# Patient Record
Sex: Female | Born: 1995 | Race: Black or African American | Hispanic: No | Marital: Single | Smoking: Never smoker
Health system: Southern US, Community
[De-identification: ages and names within clinical notes are randomized; demographics above are authoritative.]

## PROBLEM LIST (undated history)

## (undated) ENCOUNTER — Inpatient Hospital Stay (HOSPITAL_COMMUNITY): Payer: Self-pay

## (undated) DIAGNOSIS — G43909 Migraine, unspecified, not intractable, without status migrainosus: Secondary | ICD-10-CM

## (undated) DIAGNOSIS — Z8619 Personal history of other infectious and parasitic diseases: Secondary | ICD-10-CM

## (undated) DIAGNOSIS — IMO0002 Reserved for concepts with insufficient information to code with codable children: Secondary | ICD-10-CM

## (undated) HISTORY — DX: Personal history of other infectious and parasitic diseases: Z86.19

## (undated) HISTORY — PX: NO PAST SURGERIES: SHX2092

---
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
## 2013-01-06 ENCOUNTER — Encounter: Payer: Medicaid Other | Admitting: Advanced Practice Midwife

## 2013-02-17 ENCOUNTER — Encounter: Payer: Self-pay | Admitting: Advanced Practice Midwife

## 2013-02-17 ENCOUNTER — Ambulatory Visit (INDEPENDENT_AMBULATORY_CARE_PROVIDER_SITE_OTHER): Payer: Self-pay | Admitting: Advanced Practice Midwife

## 2013-02-17 VITALS — BP 115/76 | Temp 99.3°F | Wt 162.7 lb

## 2013-02-17 DIAGNOSIS — O0932 Supervision of pregnancy with insufficient antenatal care, second trimester: Secondary | ICD-10-CM | POA: Insufficient documentation

## 2013-02-17 DIAGNOSIS — N390 Urinary tract infection, site not specified: Secondary | ICD-10-CM

## 2013-02-17 DIAGNOSIS — O0930 Supervision of pregnancy with insufficient antenatal care, unspecified trimester: Secondary | ICD-10-CM

## 2013-02-17 LAB — POCT URINALYSIS DIP (DEVICE)
Bilirubin Urine: NEGATIVE
Hgb urine dipstick: NEGATIVE
Ketones, ur: NEGATIVE mg/dL
Nitrite: POSITIVE — AB
Protein, ur: NEGATIVE mg/dL
pH: 6.5 (ref 5.0–8.0)

## 2013-02-17 MED ORDER — CEPHALEXIN 500 MG PO CAPS: 500.0000 mg | ORAL_CAPSULE | Freq: Four times a day (QID) | ORAL | Status: AC

## 2013-02-17 NOTE — Patient Instructions (Addendum)
Pregnancy - Second Trimester The second trimester of pregnancy (3 to 6 months) is a period of rapid growth for you and your baby. At the end of the sixth month, your baby is about 9 inches long and weighs 1 1/2 pounds. You will begin to feel the baby move between 18 and 20 weeks of the pregnancy. This is called quickening. Weight gain is faster. A clear fluid (colostrum) may leak out of your breasts. You may feel small contractions of the womb (uterus). This is known as false labor or Braxton-Hicks contractions. This is like a practice for labor when the baby is ready to be born. Usually, the problems with morning sickness have usually passed by the end of your first trimester. Some women develop small dark blotches (called cholasma, mask of pregnancy) on their face that usually goes away after the baby is born. Exposure to the sun makes the blotches worse. Acne may also develop in some pregnant women and pregnant women who have acne, may find that it goes away. PRENATAL EXAMS  Blood work may continue to be done during prenatal exams. These tests are done to check on your health and the probable health of your baby. Blood work is used to follow your blood levels (hemoglobin). Anemia (low hemoglobin) is common during pregnancy. Iron and vitamins are given to help prevent this. You will also be checked for diabetes between 24 and 28 weeks of the pregnancy. Some of the previous blood tests may be repeated.  The size of the uterus is measured during each visit. This is to make sure that the baby is continuing to grow properly according to the dates of the pregnancy.  Your blood pressure is checked every prenatal visit. This is to make sure you are not getting toxemia.  Your urine is checked to make sure you do not have an infection, diabetes or protein in the urine.  Your weight is checked often to make sure gains are happening at the suggested rate. This is to ensure that both you and your baby are  growing normally.  Sometimes, an ultrasound is performed to confirm the proper growth and development of the baby. This is a test which bounces harmless sound waves off the baby so your caregiver can more accurately determine due dates. Sometimes, a test is done on the amniotic fluid surrounding the baby. This test is called an amniocentesis. The amniotic fluid is obtained by sticking a needle into the belly (abdomen). This is done to check the chromosomes in instances where there is a concern about possible genetic problems with the baby. It is also sometimes done near the end of pregnancy if an early delivery is required. In this case, it is done to help make sure the baby's lungs are mature enough for the baby to live outside of the womb. CHANGES OCCURING IN THE SECOND TRIMESTER OF PREGNANCY Your body goes through many changes during pregnancy. They vary from person to person. Talk to your caregiver about changes you notice that you are concerned about.  During the second trimester, you will likely have an increase in your appetite. It is normal to have cravings for certain foods. This varies from person to person and pregnancy to pregnancy.  Your lower abdomen will begin to bulge.  You may have to urinate more often because the uterus and baby are pressing on your bladder. It is also common to get more bladder infections during pregnancy. You can help this by drinking lots of fluids   and emptying your bladder before and after intercourse.  You may begin to get stretch marks on your hips, abdomen, and breasts. These are normal changes in the body during pregnancy. There are no exercises or medicines to take that prevent this change.  You may begin to develop swollen and bulging veins (varicose veins) in your legs. Wearing support hose, elevating your feet for 15 minutes, 3 to 4 times a day and limiting salt in your diet helps lessen the problem.  Heartburn may develop as the uterus grows and  pushes up against the stomach. Antacids recommended by your caregiver helps with this problem. Also, eating smaller meals 4 to 5 times a day helps.  Constipation can be treated with a stool softener or adding bulk to your diet. Drinking lots of fluids, and eating vegetables, fruits, and whole grains are helpful.  Exercising is also helpful. If you have been very active up until your pregnancy, most of these activities can be continued during your pregnancy. If you have been less active, it is helpful to start an exercise program such as walking.  Hemorrhoids may develop at the end of the second trimester. Warm sitz baths and hemorrhoid cream recommended by your caregiver helps hemorrhoid problems.  Backaches may develop during this time of your pregnancy. Avoid heavy lifting, wear low heal shoes, and practice good posture to help with backache problems.  Some pregnant women develop tingling and numbness of their hand and fingers because of swelling and tightening of ligaments in the wrist (carpel tunnel syndrome). This goes away after the baby is born.  As your breasts enlarge, you may have to get a bigger bra. Get a comfortable, cotton, support bra. Do not get a nursing bra until the last month of the pregnancy if you will be nursing the baby.  You may get a dark line from your belly button to the pubic area called the linea nigra.  You may develop rosy cheeks because of increase blood flow to the face.  You may develop spider looking lines of the face, neck, arms, and chest. These go away after the baby is born. HOME CARE INSTRUCTIONS   It is extremely important to avoid all smoking, herbs, alcohol, and unprescribed drugs during your pregnancy. These chemicals affect the formation and growth of the baby. Avoid these chemicals throughout the pregnancy to ensure the delivery of a healthy infant.  Most of your home care instructions are the same as suggested for the first trimester of your  pregnancy. Keep your caregiver's appointments. Follow your caregiver's instructions regarding medicine use, exercise, and diet.  During pregnancy, you are providing food for you and your baby. Continue to eat regular, well-balanced meals. Choose foods such as meat, fish, milk and other low fat dairy products, vegetables, fruits, and whole-grain breads and cereals. Your caregiver will tell you of the ideal weight gain.  A physical sexual relationship may be continued up until near the end of pregnancy if there are no other problems. Problems could include early (premature) leaking of amniotic fluid from the membranes, vaginal bleeding, abdominal pain, or other medical or pregnancy problems.  Exercise regularly if there are no restrictions. Check with your caregiver if you are unsure of the safety of some of your exercises. The greatest weight gain will occur in the last 2 trimesters of pregnancy. Exercise will help you:  Control your weight.  Get you in shape for labor and delivery.  Lose weight after you have the baby.  Wear   a good support or jogging bra for breast tenderness during pregnancy. This may help if worn during sleep. Pads or tissues may be used in the bra if you are leaking colostrum.  Do not use hot tubs, steam rooms or saunas throughout the pregnancy.  Wear your seat belt at all times when driving. This protects you and your baby if you are in an accident.  Avoid raw meat, uncooked cheese, cat litter boxes, and soil used by cats. These carry germs that can cause birth defects in the baby.  The second trimester is also a good time to visit your dentist for your dental health if this has not been done yet. Getting your teeth cleaned is okay. Use a soft toothbrush. Brush gently during pregnancy.  It is easier to leak urine during pregnancy. Tightening up and strengthening the pelvic muscles will help with this problem. Practice stopping your urination while you are going to the  bathroom. These are the same muscles you need to strengthen. It is also the muscles you would use as if you were trying to stop from passing gas. You can practice tightening these muscles up 10 times a set and repeating this about 3 times per day. Once you know what muscles to tighten up, do not perform these exercises during urination. It is more likely to contribute to an infection by backing up the urine.  Ask for help if you have financial, counseling, or nutritional needs during pregnancy. Your caregiver will be able to offer counseling for these needs as well as refer you for other special needs.  Your skin may become oily. If so, wash your face with mild soap, use non-greasy moisturizer and oil or cream based makeup. MEDICINES AND DRUG USE IN PREGNANCY  Take prenatal vitamins as directed. The vitamin should contain 1 milligram of folic acid. Keep all vitamins out of reach of children. Only a couple vitamins or tablets containing iron may be fatal to a baby or young child when ingested.  Avoid use of all medicines, including herbs, over-the-counter medicines, not prescribed or suggested by your caregiver. Only take over-the-counter or prescription medicines for pain, discomfort, or fever as directed by your caregiver. Do not use aspirin.  Let your caregiver also know about herbs you may be using.  Alcohol is related to a number of birth defects. This includes fetal alcohol syndrome. All alcohol, in any form, should be avoided completely. Smoking will cause low birth rate and premature babies.  Street or illegal drugs are very harmful to the baby. They are absolutely forbidden. A baby born to an addicted mother will be addicted at birth. The baby will go through the same withdrawal an adult does. SEEK MEDICAL CARE IF:  You have any concerns or worries during your pregnancy. It is better to call with your questions if you feel they cannot wait, rather than worry about them. SEEK IMMEDIATE  MEDICAL CARE IF:   An unexplained oral temperature above 102 F (38.9 C) develops, or as your caregiver suggests.  You have leaking of fluid from the vagina (birth canal). If leaking membranes are suspected, take your temperature and tell your caregiver of this when you call.  There is vaginal spotting, bleeding, or passing clots. Tell your caregiver of the amount and how many pads are used. Light spotting in pregnancy is common, especially following intercourse.  You develop a bad smelling vaginal discharge with a change in the color from clear to white.  You continue to feel   sick to your stomach (nauseated) and have no relief from remedies suggested. You vomit blood or coffee ground-like materials.  You lose more than 2 pounds of weight or gain more than 2 pounds of weight over 1 week, or as suggested by your caregiver.  You notice swelling of your face, hands, feet, or legs.  You get exposed to German measles and have never had them.  You are exposed to fifth disease or chickenpox.  You develop belly (abdominal) pain. Round ligament discomfort is a common non-cancerous (benign) cause of abdominal pain in pregnancy. Your caregiver still must evaluate you.  You develop a bad headache that does not go away.  You develop fever, diarrhea, pain with urination, or shortness of breath.  You develop visual problems, blurry, or double vision.  You fall or are in a car accident or any kind of trauma.  There is mental or physical violence at home. Document Released: 08/06/2001 Document Revised: 05/06/2012 Document Reviewed: 02/08/2009 [employer] Patient Information 2014 [employer].  

## 2013-02-17 NOTE — Progress Notes (Signed)
New OB. See other note   Subjective:    Shelby Olsen is a G2P1001 [redacted]w[redacted]d being seen today for her first obstetrical visit.  Her obstetrical history is significant for obesity and Hyperemesis first trimester. Patient does intend to breast feed. Pregnancy history fully reviewed.  Patient reports Round ligament pain.  Filed Vitals:   02/17/13 0824  BP: 115/76  Temp: 99.3 F (37.4 C)  Weight: 73.8 kg (162 lb 11.2 oz)    HISTORY: OB History   Grav Para Term Preterm Abortions TAB SAB Ect Mult Living   2 1 1  0 0 0 0 0 0 1     # Outc Date GA Lbr Len/2nd Wgt Sex Del Anes PTL Lv   1 TRM 10/12 [redacted]w[redacted]d  1.610RU([id number]) M SVD EPI  Yes   2 CUR              Past Medical History  Diagnosis Date  . Medical history non-contributory   . Migraine    Past Surgical History  Procedure Laterality Date  . No past surgeries     Family History  Problem Relation Age of Onset  . Hypertension Mother   . Asthma Sister   . Asthma Brother      Exam    Uterus:  Fundal Height: 18 cm  Pelvic Exam:    Perineum: N/a   Vulva: Deferred due to previous exam   Vagina:  n/a   pH:    Cervix: deferred   Adnexa: not evaluated   Bony Pelvis: gynecoid  System: Breast:  normal appearance, no masses or tenderness   Skin: normal coloration and turgor, no rashes    Neurologic: oriented, normal, grossly non-focal   Extremities: normal strength, tone, and muscle mass   HEENT neck supple with midline trachea   Mouth/Teeth mucous membranes moist, pharynx normal without lesions   Neck supple and no masses   Cardiovascular: regular rate and rhythm   Respiratory:  appears well, vitals normal, no respiratory distress, acyanotic, normal RR, ear and throat exam is normal, neck free of mass or lymphadenopathy   Abdomen: soft, non-tender; bowel sounds normal; no masses,  no organomegaly   Urinary: n/a      Assessment:    Pregnancy: G2P1001 Patient Active Problem List   Diagnosis Date Noted  . Late  prenatal care complicating pregnancy in second trimester 02/17/2013  . UTI (lower urinary tract infection) 02/17/2013  . Hyperemesis complicating pregnancy, antepartum 12/22/2012        Plan:     Initial labs drawn. Prenatal vitamins. Problem list reviewed and updated. Genetic Screening discussed Quad Screen: ordered.  Ultrasound discussed; fetal survey: ordered.  Follow up in 4 weeks. 50% of 30 min visit spent on counseling and coordination of care.     [facility] 02/17/2013

## 2013-02-17 NOTE — Progress Notes (Signed)
Pulse- 112 Patient reports severe sharp pains in lower abdomen/pelvis

## 2013-02-18 LAB — OBSTETRIC PANEL
Antibody Screen: NEGATIVE
Basophils Absolute: 0 10*3/uL (ref 0.0–0.1)
Basophils Relative: 0 % (ref 0–1)
Eosinophils Absolute: 0.1 10*3/uL (ref 0.0–0.7)
HCT: 34.9 % — ABNORMAL LOW (ref 36.0–46.0)
Hemoglobin: 11.6 g/dL — ABNORMAL LOW (ref 12.0–15.0)
MCH: 27.8 pg (ref 26.0–34.0)
MCHC: 33.2 g/dL (ref 30.0–36.0)
Monocytes Absolute: 1.1 10*3/uL — ABNORMAL HIGH (ref 0.1–1.0)
Monocytes Relative: 8 % (ref 3–12)
RDW: 13.9 % (ref 11.5–15.5)
Rh Type: POSITIVE

## 2013-02-18 LAB — GLUCOSE TOLERANCE, 1 HOUR (50G) W/O FASTING: Glucose, 1 Hour GTT: 75 mg/dL (ref 70–140)

## 2013-02-22 ENCOUNTER — Ambulatory Visit (HOSPITAL_COMMUNITY)
Admission: RE | Admit: 2013-02-22 | Discharge: 2013-02-22 | Disposition: A | Discharge status: Home / Self Care | Payer: Self-pay | Source: Ambulatory Visit | Attending: Advanced Practice Midwife | Admitting: Advanced Practice Midwife

## 2013-02-22 DIAGNOSIS — Z363 Encounter for antenatal screening for malformations: Secondary | ICD-10-CM | POA: Insufficient documentation

## 2013-02-22 DIAGNOSIS — O0932 Supervision of pregnancy with insufficient antenatal care, second trimester: Secondary | ICD-10-CM

## 2013-02-22 DIAGNOSIS — Z1389 Encounter for screening for other disorder: Secondary | ICD-10-CM | POA: Insufficient documentation

## 2013-02-22 DIAGNOSIS — O358XX0 Maternal care for other (suspected) fetal abnormality and damage, not applicable or unspecified: Secondary | ICD-10-CM | POA: Insufficient documentation

## 2013-02-22 LAB — HEMOGLOBINOPATHY EVALUATION
Hgb A2 Quant: 2.8 % (ref 2.2–3.2)
Hgb A: 97.2 % (ref 96.8–97.8)
Hgb F Quant: 0 % (ref 0.0–2.0)
Hgb S Quant: 0 %

## 2013-02-22 LAB — CULTURE, OB URINE

## 2013-02-23 ENCOUNTER — Encounter: Payer: Self-pay | Admitting: *Deleted

## 2013-03-17 ENCOUNTER — Encounter: Payer: Medicaid Other | Admitting: Obstetrics and Gynecology

## 2013-05-26 ENCOUNTER — Ambulatory Visit (INDEPENDENT_AMBULATORY_CARE_PROVIDER_SITE_OTHER): Payer: Self-pay | Admitting: Advanced Practice Midwife

## 2013-05-26 VITALS — BP 125/80 | Temp 97.3°F | Wt 175.1 lb

## 2013-05-26 DIAGNOSIS — O0932 Supervision of pregnancy with insufficient antenatal care, second trimester: Secondary | ICD-10-CM

## 2013-05-26 DIAGNOSIS — O0930 Supervision of pregnancy with insufficient antenatal care, unspecified trimester: Secondary | ICD-10-CM

## 2013-05-26 LAB — CBC
HCT: 34.5 % — ABNORMAL LOW (ref 36.0–46.0)
Hemoglobin: 11.7 g/dL — ABNORMAL LOW (ref 12.0–15.0)
MCV: 79.9 fL (ref 78.0–100.0)
RDW: 13.5 % (ref 11.5–15.5)
WBC: 11.6 10*3/uL — ABNORMAL HIGH (ref 4.0–10.5)

## 2013-05-26 LAB — POCT URINALYSIS DIP (DEVICE)
Bilirubin Urine: NEGATIVE
Glucose, UA: NEGATIVE mg/dL
Nitrite: POSITIVE — AB
Urobilinogen, UA: 1 mg/dL (ref 0.0–1.0)
pH: 7.5 (ref 5.0–8.0)

## 2013-05-26 LAB — RPR

## 2013-05-26 MED ORDER — PHENAZOPYRIDINE HCL 100 MG PO TABS: 100.0000 mg | ORAL_TABLET | Freq: Three times a day (TID) | ORAL | Status: DC | PRN

## 2013-05-26 MED ORDER — NITROFURANTOIN MONOHYD MACRO 100 MG PO CAPS: 100.0000 mg | ORAL_CAPSULE | Freq: Two times a day (BID) | ORAL | Status: DC

## 2013-05-26 MED ORDER — CEPHALEXIN 500 MG PO CAPS: 500.0000 mg | ORAL_CAPSULE | Freq: Four times a day (QID) | ORAL | Status: AC

## 2013-05-26 NOTE — Progress Notes (Signed)
P= 115 Pt. Reports sharp pain in lower abdomen/pelvic area every time she needs to urinate, started about a week ago.  Pt. Refused flu shot.

## 2013-05-26 NOTE — Progress Notes (Signed)
Reports sharp abdominal pain when urinating/needs to urinate, also regular contractions off and on mostly at night.   Good fetal movement, denies vaginal bleeding, LOF.  Positive nitrites in urine this visit.  Macrobid 100 mg BID x7 days, pyridium 100 mg TID PRN.

## 2013-05-27 LAB — GLUCOSE TOLERANCE, 1 HOUR (50G) W/O FASTING: Glucose, 1 Hour GTT: 84 mg/dL (ref 70–140)

## 2013-06-09 ENCOUNTER — Encounter: Payer: Self-pay | Admitting: Obstetrics and Gynecology

## 2013-06-09 ENCOUNTER — Encounter: Payer: Self-pay | Admitting: Advanced Practice Midwife

## 2013-07-01 ENCOUNTER — Encounter (HOSPITAL_COMMUNITY): Payer: Self-pay

## 2013-07-01 ENCOUNTER — Inpatient Hospital Stay (HOSPITAL_COMMUNITY)
Admission: AD | Admit: 2013-07-01 | Discharge: 2013-07-01 | Disposition: A | Discharge status: Home / Self Care | Payer: Medicaid Other | Source: Ambulatory Visit | Attending: Obstetrics & Gynecology | Admitting: Obstetrics & Gynecology

## 2013-07-01 DIAGNOSIS — O479 False labor, unspecified: Secondary | ICD-10-CM | POA: Insufficient documentation

## 2013-07-01 NOTE — MAU Note (Signed)
Pt states uc began around 0315 and were every 6-7 mins. Denies LOF or vaginal bleeding. States good FM

## 2013-07-06 ENCOUNTER — Encounter (HOSPITAL_COMMUNITY): Payer: Self-pay | Admitting: *Deleted

## 2013-07-06 ENCOUNTER — Inpatient Hospital Stay (HOSPITAL_COMMUNITY)
Admission: AD | Admit: 2013-07-06 | Discharge: 2013-07-06 | Disposition: A | Discharge status: Home / Self Care | Payer: Medicaid Other | Source: Ambulatory Visit | Attending: Obstetrics & Gynecology | Admitting: Obstetrics & Gynecology

## 2013-07-06 DIAGNOSIS — O26859 Spotting complicating pregnancy, unspecified trimester: Secondary | ICD-10-CM | POA: Insufficient documentation

## 2013-07-06 DIAGNOSIS — B373 Candidiasis of vulva and vagina: Secondary | ICD-10-CM

## 2013-07-06 DIAGNOSIS — B3731 Acute candidiasis of vulva and vagina: Secondary | ICD-10-CM

## 2013-07-06 DIAGNOSIS — O2390 Unspecified genitourinary tract infection in pregnancy, unspecified trimester: Secondary | ICD-10-CM | POA: Insufficient documentation

## 2013-07-06 LAB — WET PREP, GENITAL
Clue Cells Wet Prep HPF POC: NONE SEEN
Trich, Wet Prep: NONE SEEN

## 2013-07-06 MED ORDER — FLUCONAZOLE 150 MG PO TABS: 150.0000 mg | ORAL_TABLET | Freq: Once | ORAL | Status: DC

## 2013-07-06 NOTE — MAU Provider Note (Addendum)
History     CSN: [id number]  Arrival date and time: 07/06/13 1436   None     Chief Complaint  Patient presents with  . Vaginal Discharge   HPI  Shelby Olsen is a 17yp G2P1001 who presents today at [redacted]w[redacted]d with c/o greenish vaginal d/c and spotting.  She reports that since she woke up this morning, she has had LBP and feelings of increased pelvic pressure and nausea.  She states that she feels like she is having ctx, and denies feeling anything like this previously.  She receives her routine prenatal care at the [facility] and denies any complications during this pregnancy.  She denies any medical issues or conditions. She has NKDA and denies taking any medications. She is a non-smoker, and denies any alcohol or other drug use. VS reviewed: HR tachycardic in 120s, BP stable at 118/72, 97.8 F temp. U/s reviewed prior to vaginal exam: placenta anterior, above cervical os; no previa present.   At 6am this morning Shelby Olsen states that she noticed green, thick, slimy, and malodorous vaginal d/c.  She denies any leakage of fluids, but does state that she has noticed small amounts of blood on the toilet paper when wiping since. She reports good FM.  She is sexually active, and reports having vaginal intercourse in the past 48 hours.  She denies any exposure to STDs.  She is also reporting a mild HA, that feels like her typical HAs which usually go away on their own.  She also reports increased SOB for the past few days, which is worsened with movement.  She denies any scotomata, CP, vomiting, diarrhea, constipation, LE swelling or pain, fever or chills.  She was dx with a UTI on 10/1, but pt reports not taking her ABX bc her insurance wouldn't cover her Rx.  She denies flank pain, dysuria, increased urinary frequency, or burning with urination.    OB History   Grav Para Term Preterm Abortions TAB SAB Ect Mult Living   2 1 1  0 0 0 0 0 0 1      Past Medical History  Diagnosis Date  . Migraine     Past  Surgical History  Procedure Laterality Date  . No past surgeries      Family History  Problem Relation Age of Onset  . Hypertension Mother   . Asthma Sister   . Asthma Brother     History  Substance Use Topics  . Smoking status: Never Smoker   . Smokeless tobacco: Never Used  . Alcohol Use: No    Allergies:  Allergies  Allergen Reactions  . Peanuts [Peanut Oil] Anaphylaxis and Hives    Prescriptions prior to admission  Medication Sig Dispense Refill  . phenazopyridine (PYRIDIUM) 100 MG tablet Take 1 tablet (100 mg total) by mouth 3 (three) times daily as needed for pain.  10 tablet  0  . Prenatal Vit-Fe Fumarate-FA (MULTIVITAMIN-PRENATAL) 27-0.8 MG TABS tablet Take 1 tablet by mouth daily at 12 noon.        Review of Systems  Constitutional: Negative for fever and chills.  Eyes: Negative for blurred vision.  Respiratory: Positive for shortness of breath. Negative for cough, hemoptysis and wheezing.        For a few days, worse with ambulation  Cardiovascular: Negative for chest pain and leg swelling.  Gastrointestinal: Positive for nausea and abdominal pain. Negative for heartburn, vomiting, diarrhea and constipation.       Increased pelvic pressure.  Diffuse abdominal  discomfort.   Genitourinary: Negative for dysuria, frequency and flank pain.  Musculoskeletal: Positive for back pain.  Neurological: Positive for headaches.   Physical Exam   Blood pressure 118/72, pulse 121, temperature 97.8 F (36.6 C), temperature source Oral, resp. rate 18, height 4\' 11"  (1.499 m), weight 79.833 kg (176 lb), last menstrual period 10/11/2012, SpO2 97.00%.  Physical Exam  Constitutional: She appears well-developed and well-nourished. No distress.  HENT:  Head: Normocephalic and atraumatic.  Eyes: EOM are normal.  Cardiovascular: Regular rhythm, normal heart sounds and intact distal pulses.  Exam reveals no gallop and no friction rub.   No murmur heard. tachycardic   Respiratory: Effort normal and breath sounds normal. No respiratory distress. She has no wheezes.  GI: Bowel sounds are normal. She exhibits distension. There is tenderness.  Gravida abdomen.  Mild tenderness to palpation RUQ>LUQ  Genitourinary: Vaginal discharge found.  Pelvic exam: VULVA: normal appearing vulva with no masses, tenderness or lesions, VAGINA: vaginal discharge - white, creamy, frothy, green, odorless and thick; no fluid pooling or leakage noted CERVIX: friable with q-tip, erythematous and punctate lesions, cervical discharge present - white, copious, creamy, frothy, odorless and thick, DNA probe for chlamydia and GC obtained, cervical motion tenderness absent, multiparous os, UTERUS: enlarged to 38 week's size, ADNEXA: tenderness bilateral, WET MOUNT done - results: many yeast, WBCs.    Results for orders placed during the hospital encounter of 07/06/13 (from the past 24 hour(s))  WET PREP, GENITAL     Status: Abnormal   Collection Time    07/06/13  3:20 PM      Result Value Range   Yeast Wet Prep HPF POC MANY (*) NONE SEEN   Trich, Wet Prep NONE SEEN  NONE SEEN   Clue Cells Wet Prep HPF POC NONE SEEN  NONE SEEN   WBC, Wet Prep HPF POC MANY (*) NONE SEEN    MAU Course  Procedures H/A got better MDM OBIX monitoring: baseline 140s, moderate variability, no accels, no decels; no traceable ctx H&P Vaginal exam with speculum: GC/Chlamydia and wet prep obtained; ferning not indicated Many yeast and many WBC seen on wet prep D/C patient home   Assessment and Plan  A: -Vaginal yeast infection -Pregnancy, third trimester  P:   -Rx: Diflucan 150mg  PO x1 dose -Return to MAU with worsening or new symptoms -Continue with prenatal visits Christiana Fuchs 07/06/2013, 3:05 PM     Medication List         fluconazole 150 MG tablet  Commonly known as:  DIFLUCAN  Take 1 tablet (150 mg total) by mouth once.     multivitamin-prenatal 27-0.8 MG Tabs tablet  Take 1 tablet  by mouth daily at 12 noon.     phenazopyridine 100 MG tablet  Commonly known as:  PYRIDIUM  Take 1 tablet (100 mg total) by mouth 3 (three) times daily as needed for pain.       Follow-up Information   Follow up with [facility] In 1 week. (Someone will call with your appointment)    Specialty:  Obstetrics and Gynecology   Contact information:   [address] [place] [address] [phone]    Evaluation and management procedures were performed by PA-S under my supervision/collaboration. Chart reviewed, patient examined by me and I agree with management and plan. Danae Orleans, CNM 07/06/2013 4:49 PM  Addendum: Misread PA-S note above saying "no accels" which is in error: FHR tracing reactive with accelerations. Danae Orleans, CNM  07/13/2013 1:28 PM

## 2013-07-06 NOTE — MAU Note (Signed)
States she felt a "pop." Then when she went to the bathroom, she noticed green slimy D/C on toilet paper when she wiped. Then some spots of blood.

## 2013-07-06 NOTE — MAU Provider Note (Signed)
Attestation of Attending Supervision of Advanced Practitioner (PA/CNM/NP): Evaluation and management procedures were performed by the Advanced Practitioner under my supervision and collaboration.  I have reviewed the Advanced Practitioner's note and chart, and I agree with the management and plan.  Karlisha Mathena, MD, FACOG Attending Obstetrician & Gynecologist Faculty Practice, [facility]  

## 2013-07-07 ENCOUNTER — Encounter (INDEPENDENT_AMBULATORY_CARE_PROVIDER_SITE_OTHER): Payer: Medicaid Other | Admitting: Obstetrics and Gynecology

## 2013-07-07 DIAGNOSIS — O0930 Supervision of pregnancy with insufficient antenatal care, unspecified trimester: Secondary | ICD-10-CM

## 2013-07-07 LAB — GC/CHLAMYDIA PROBE AMP: GC Probe RNA: NEGATIVE

## 2013-07-07 NOTE — Progress Notes (Signed)
Error (seen yesterday so rescheduled)

## 2013-07-13 ENCOUNTER — Inpatient Hospital Stay (HOSPITAL_COMMUNITY): Payer: Medicaid Other

## 2013-07-13 ENCOUNTER — Inpatient Hospital Stay (HOSPITAL_COMMUNITY)
Admission: AD | Admit: 2013-07-13 | Discharge: 2013-07-14 | DRG: 775 | Disposition: A | Discharge status: Home / Self Care | Payer: Medicaid Other | Source: Ambulatory Visit | Attending: Obstetrics & Gynecology | Admitting: Obstetrics & Gynecology

## 2013-07-13 ENCOUNTER — Encounter: Payer: Self-pay | Admitting: Obstetrics and Gynecology

## 2013-07-13 ENCOUNTER — Encounter (HOSPITAL_COMMUNITY): Payer: Self-pay | Admitting: *Deleted

## 2013-07-13 DIAGNOSIS — O0933 Supervision of pregnancy with insufficient antenatal care, third trimester: Secondary | ICD-10-CM

## 2013-07-13 DIAGNOSIS — IMO0002 Reserved for concepts with insufficient information to code with codable children: Secondary | ICD-10-CM

## 2013-07-13 DIAGNOSIS — O0932 Supervision of pregnancy with insufficient antenatal care, second trimester: Secondary | ICD-10-CM

## 2013-07-13 DIAGNOSIS — O364XX0 Maternal care for intrauterine death, not applicable or unspecified: Secondary | ICD-10-CM

## 2013-07-13 LAB — CBC
HCT: 36.3 % (ref 36.0–46.0)
Hemoglobin: 12.2 g/dL (ref 12.0–15.0)
MCHC: 33.6 g/dL (ref 30.0–36.0)
MCV: 79.8 fL (ref 78.0–100.0)
Platelets: 192 10*3/uL (ref 150–400)
RBC: 4.55 MIL/uL (ref 3.87–5.11)
RDW: 13.7 % (ref 11.5–15.5)
WBC: 16.2 10*3/uL — ABNORMAL HIGH (ref 4.0–10.5)

## 2013-07-13 LAB — RPR: RPR Ser Ql: NONREACTIVE

## 2013-07-13 LAB — TYPE AND SCREEN

## 2013-07-13 MED ORDER — SIMETHICONE 80 MG PO CHEW: 80.0000 mg | CHEWABLE_TABLET | ORAL | Status: DC | PRN

## 2013-07-13 MED ORDER — ZOLPIDEM TARTRATE 5 MG PO TABS
5.0000 mg | ORAL_TABLET | Freq: Every evening | ORAL | Status: DC | PRN
  Administered 2013-07-13: 5 mg via ORAL
  Filled 2013-07-13: qty 1

## 2013-07-13 MED ORDER — IBUPROFEN 600 MG PO TABS
600.0000 mg | ORAL_TABLET | Freq: Four times a day (QID) | ORAL | Status: DC | PRN
  Administered 2013-07-13: 600 mg via ORAL
  Filled 2013-07-13: qty 1

## 2013-07-13 MED ORDER — LIDOCAINE HCL (PF) 1 % IJ SOLN
30.0000 mL | INTRAMUSCULAR | Status: DC | PRN
  Filled 2013-07-13: qty 30

## 2013-07-13 MED ORDER — FLEET ENEMA 7-19 GM/118ML RE ENEM: 1.0000 | ENEMA | RECTAL | Status: DC | PRN

## 2013-07-13 MED ORDER — OXYCODONE-ACETAMINOPHEN 5-325 MG PO TABS
1.0000 | ORAL_TABLET | ORAL | Status: DC | PRN
  Administered 2013-07-13: 2 via ORAL
  Filled 2013-07-13: qty 2

## 2013-07-13 MED ORDER — ONDANSETRON HCL 4 MG/2ML IJ SOLN: 4.0000 mg | INTRAMUSCULAR | Status: DC | PRN

## 2013-07-13 MED ORDER — CITRIC ACID-SODIUM CITRATE 334-500 MG/5ML PO SOLN: 30.0000 mL | ORAL | Status: DC | PRN

## 2013-07-13 MED ORDER — BENZOCAINE-MENTHOL 20-0.5 % EX AERO
1.0000 "application " | INHALATION_SPRAY | CUTANEOUS | Status: DC | PRN
  Filled 2013-07-13: qty 56

## 2013-07-13 MED ORDER — DIPHENHYDRAMINE HCL 25 MG PO CAPS: 25.0000 mg | ORAL_CAPSULE | Freq: Four times a day (QID) | ORAL | Status: DC | PRN

## 2013-07-13 MED ORDER — WITCH HAZEL-GLYCERIN EX PADS: 1.0000 "application " | MEDICATED_PAD | CUTANEOUS | Status: DC | PRN

## 2013-07-13 MED ORDER — LACTATED RINGERS IV SOLN: INTRAVENOUS | Status: DC

## 2013-07-13 MED ORDER — OXYTOCIN BOLUS FROM INFUSION: 500.0000 mL | INTRAVENOUS | Status: DC

## 2013-07-13 MED ORDER — IBUPROFEN 600 MG PO TABS
600.0000 mg | ORAL_TABLET | Freq: Four times a day (QID) | ORAL | Status: DC
  Administered 2013-07-13 – 2013-07-14 (×4): 600 mg via ORAL
  Filled 2013-07-13 (×3): qty 1

## 2013-07-13 MED ORDER — PRENATAL MULTIVITAMIN CH: 1.0000 | ORAL_TABLET | Freq: Every day | ORAL | Status: DC

## 2013-07-13 MED ORDER — LACTATED RINGERS IV SOLN: 500.0000 mL | INTRAVENOUS | Status: DC | PRN

## 2013-07-13 MED ORDER — ACETAMINOPHEN 325 MG PO TABS: 650.0000 mg | ORAL_TABLET | ORAL | Status: DC | PRN

## 2013-07-13 MED ORDER — TETANUS-DIPHTH-ACELL PERTUSSIS 5-2.5-18.5 LF-MCG/0.5 IM SUSP
0.5000 mL | Freq: Once | INTRAMUSCULAR | Status: AC
  Administered 2013-07-14: 0.5 mL via INTRAMUSCULAR
  Filled 2013-07-13: qty 0.5

## 2013-07-13 MED ORDER — DIBUCAINE 1 % RE OINT
1.0000 "application " | TOPICAL_OINTMENT | RECTAL | Status: DC | PRN
  Filled 2013-07-13: qty 28

## 2013-07-13 MED ORDER — ONDANSETRON HCL 4 MG PO TABS: 4.0000 mg | ORAL_TABLET | ORAL | Status: DC | PRN

## 2013-07-13 MED ORDER — OXYTOCIN 40 UNITS IN LACTATED RINGERS INFUSION - SIMPLE MED
62.5000 mL/h | INTRAVENOUS | Status: DC
  Administered 2013-07-13: 62.5 mL/h via INTRAVENOUS
  Filled 2013-07-13: qty 1000

## 2013-07-13 MED ORDER — ONDANSETRON HCL 4 MG/2ML IJ SOLN: 4.0000 mg | Freq: Four times a day (QID) | INTRAMUSCULAR | Status: DC | PRN

## 2013-07-13 MED ORDER — LANOLIN HYDROUS EX OINT: TOPICAL_OINTMENT | CUTANEOUS | Status: DC | PRN

## 2013-07-13 MED ORDER — BUTORPHANOL TARTRATE 1 MG/ML IJ SOLN
2.0000 mg | Freq: Once | INTRAMUSCULAR | Status: AC
  Administered 2013-07-13: 1 mg via INTRAVENOUS
  Filled 2013-07-13: qty 2

## 2013-07-13 MED ORDER — SENNOSIDES-DOCUSATE SODIUM 8.6-50 MG PO TABS
2.0000 | ORAL_TABLET | ORAL | Status: DC
  Administered 2013-07-13: 2 via ORAL
  Filled 2013-07-13: qty 2

## 2013-07-13 NOTE — Progress Notes (Signed)
07/13/13 1200  Clinical Encounter Type  Visited With Patient and family together (boyfriend/FOB Marcello Moores, sister Patsy Lager)  Visit Type Follow-up;Spiritual support;Social support  Spiritual Encounters  Spiritual Needs Grief support;Emotional   Followed up with pt and family, meeting dad for the first time.  Parents primarily needed private time together with baby Imoni.  Spoke in detail with sister Patsy Lager, answering questions and providing emotional support, grief education, and information about bereavement support resources.  Will continue to follow family for bereavement care and emotional support.  [address] [place], [place] [phone]

## 2013-07-13 NOTE — Progress Notes (Signed)
UR completed 

## 2013-07-13 NOTE — H&P (Signed)
Shelby Olsen is a 17 y.o. female presenting for contractions that started last night.  Pt arrived via EMS.  No fetal heart rate noted upon arrival, emergency ultrasound obtained, fetal demise confirmed.  Pt received prenatal care in Low Risk clinic starting at 18 wks.  Pt's next visit was at 32.  All labs and ultrasound were normal.  Reports last fetal movement approximately two hours prior to arrival.  Denies vaginal bleeding or constant abdominal pain. Vital signs normal throughout the pregnancy.    Maternal Medical History:  Reason for admission: Contractions.   Contractions: Onset was 6-12 hours ago.   Frequency: regular.    Fetal activity: Pt reports feeling last movement a couple hours before arrival to hospital.  Prenatal complications: no prenatal complications   OB History   Grav Para Term Preterm Abortions TAB SAB Ect Mult Living   2 1 1  0 0 0 0 0 0 1     Past Medical History  Diagnosis Date  . Migraine    Past Surgical History  Procedure Laterality Date  . No past surgeries     Family History: family history includes Asthma in her brother and sister; Hypertension in her mother. Social History:  reports that she has never smoked. She has never used smokeless tobacco. She reports that she does not drink alcohol or use illicit drugs.   Prenatal Transfer Tool  Maternal Diabetes: No Genetic Screening: Normal Maternal Ultrasounds/Referrals: Normal Fetal Ultrasounds or other Referrals:  None Maternal Substance Abuse:  No Significant Maternal Medications:  None Significant Maternal Lab Results:  Lab values include: Other:  GBS unknown Other Comments:  None  Review of Systems  Gastrointestinal: Positive for abdominal pain (contractions).  All other systems reviewed and are negative.      Blood pressure 121/65, pulse 105, temperature 98.3 F (36.8 C), temperature source Oral, resp. rate 22, height 4\' 11"  (1.499 m), weight 80.74 kg (178 lb), last menstrual  period 10/11/2012. Maternal Exam:  Abdomen: Fetal presentation: vertex  Introitus: Vagina is positive for vaginal discharge (thick discharge, +odor).    Physical Exam  Constitutional: She is oriented to person, place, and time. She appears well-developed and well-nourished.  HENT:  Head: Normocephalic.  Neck: Normal range of motion. Neck supple.  Cardiovascular: Normal rate, regular rhythm and normal heart sounds.   Respiratory: Effort normal and breath sounds normal.  GI: Soft. There is no tenderness.  Genitourinary: No bleeding around the vagina. Vaginal discharge (thick discharge, +odor) found.  Neurological: She is alert and oriented to person, place, and time.  Skin: Skin is warm and dry.     Cervix 7 cm in MAU with quick progression to complete (BBOW) Prenatal labs: ABO, Rh: A/POS/-- (06/25 0944) Antibody: NEG (06/25 0944) Rubella: 4.60 (06/25 0944) RPR: NON REAC (10/01 1019)  HBsAg: NEGATIVE (06/25 0944)  HIV: NON REACTIVE (10/01 1019)  GBS:     Assessment/Plan: 17 yo G2P1001 at [redacted]w[redacted]d wks IUP Intrauterine Fetal Demise Active Labor  Plan: Admit to Birthing Suites Anticipate NSVD   [facility] 07/13/2013, 8:20 AM

## 2013-07-13 NOTE — Progress Notes (Signed)
07/13/13 1030  Clinical Encounter Type  Visited With Patient and family together (mom Marcelino Duster)  Visit Type Spiritual support;Social support (IUFD)  Referral From Nurse  Spiritual Encounters  Spiritual Needs Grief support;Emotional  Stress Factors  Patient Stress Factors Loss;Major life changes;Financial concerns  Family Stress Factors Loss   Visited with Marisue Ivan and her mother Marcelino Duster to offer spiritual and emotional support after delivery.  Provided pastoral presence, reflective listening, staff liaison support, and early grief education, affirming that disbelief and numbness are very typical of the beginning of a bereavement experience.  Marisue Ivan was tearful and also observed, "When this really hits me, I'm going to cry for a week." We began to process some of the feelings, experiences, and need for help/support that will come as the hours, days, and weeks progress.    Liz's boyfriend, two-year-old son Gearlean Alf (spelling?), and perhaps other family members are on the way.  With pt's permission, I will bring a comfort toy (stuffed bear) for Javion and will continue to check on family through the day.  Please also page as needs arise:  [phone].  Thank you.  [address] [place], [place] [phone]

## 2013-07-13 NOTE — H&P (Signed)
Attestation of Attending Supervision of Advanced Practitioner (CNM/NP): Evaluation and management procedures were performed by the Advanced Practitioner under my supervision and collaboration.  I have reviewed the Advanced Practitioner's note and chart, and I agree with the management and plan.  HARRAWAY-SMITH, Myli Pae 9:32 AM

## 2013-07-13 NOTE — MAU Note (Signed)
Contractions started early this morning.  No bleeding or leaking. Was here on Saturday, was 3 cm.  2nd preg, uncomplicated pregnancies.

## 2013-07-14 MED ORDER — IBUPROFEN 600 MG PO TABS: 600.0000 mg | ORAL_TABLET | Freq: Four times a day (QID) | ORAL | Status: DC

## 2013-07-14 NOTE — Progress Notes (Signed)
07/14/13 1000  Clinical Encounter Type  Visited With Patient and family together  Visit Type Follow-up  Spiritual Encounters  Spiritual Needs Grief support;Emotional   Checked in with Marisue Ivan prior to discharge, offering continued emotional support and reminding her of Comfort resources (and encouraging her to reach out any time).  We discussed her self-care plan, including accepting help and making space to grieve in the midst of parenting.  Her immediate plan includes accepting son Javion's grandmother's offer to keep him this weekend so that Marisue Ivan can have time and space to process on her own.  She is aware of ongoing chaplain availability for a 1:1 support meeting or resource referral even after discharge.  Per pt, she has phoned [facility]ango and plans to locate a cemetary to use and then to follow up with funeral home after discharge.  [address] [place], [place] [phone]

## 2013-07-14 NOTE — MAU Provider Note (Signed)

## 2013-07-14 NOTE — Progress Notes (Signed)
Visited patient on referral from Day Shift Chaplain.  Present was 2 friends.  Patient stated a number of times that she still can't believe the event.  We talked some about how her feelings might unfold, about going home to see the baby's room, which will now be empty and other aspects that will begin to make the loss sink in.  Affirmed her right to cry, to be upset, even to be angry at God.  We discussed the randomness of tragedies such as this.    Affirmed the support of friends and family, and affirmed her efforts to do this pregnancy as healthy as possible for her.  Commended her efforts and "discarded" any guilt, potential or current.  Affirmed her wherewithal to walk through this.  We also discussed how to talk to her other child, who is just shy of 2.  Rema Jasmine, Chaplain Pager: [phone]

## 2013-07-14 NOTE — Discharge Summary (Signed)
Attestation of Attending Supervision of Advanced Practitioner (CNM/NP): Evaluation and management procedures were performed by the Advanced Practitioner under my supervision and collaboration.  I have reviewed the Advanced Practitioner's note and chart, and I agree with the management and plan.  Jadynn Epping 07/14/2013 10:22 AM   

## 2013-07-14 NOTE — Discharge Summary (Signed)
Obstetric Discharge Summary Reason for Admission: onset of labor Prenatal Procedures:  ultrasound Intrapartum Procedures: spontaneous vaginal delivery of a term IUFD Postpartum Procedures: none Complications-Operative and Postpartum: none Hemoglobin  Date Value Range Status  07/13/2013 12.2  12.0 - 15.0 g/dL Final     HCT  Date Value Range Status  07/13/2013 36.3  36.0 - 46.0 % Final    Physical Exam:  General: alert, cooperative and no distress Lochia: appropriate Uterine Fundus: firm Incision: na DVT Evaluation: No evidence of DVT seen on physical exam.  Discharge Diagnoses: Term IUFD s/p SVD  Discharge Information: Date: 07/14/2013 Activity: pelvic rest Diet: routine Medications: PNV and Ibuprofen Condition: stable Instructions: refer to practice specific booklet Discharge to: home Follow-up Information   Follow up with [facility] In 4 weeks.   Specialty:  Obstetrics and Gynecology   Contact information:   [address] [place] [address] [phone]      Newborn Data: Live born female  Birth Weight: 5 lb 8.8 oz (2517 g) APGAR: 0, 0  Home with mother.  Pt presented in active labor to the MAU and without doppler-able heart tones. A bedside ultrasound confirmed the presence of an IUFD. She progressed quickly and delivered a term IUFD. No complications in the delivery. Her postpartum care was uncomplicated. The baby was taken to the morgue with plans for an autopsy.  She is unsure about birth control at this time given the circumstances and will f/u in the clinic in 4 weeks.    Amoni Scallan L 07/14/2013, 9:31 AM

## 2013-07-19 ENCOUNTER — Telehealth: Payer: Self-pay | Admitting: *Deleted

## 2013-07-19 NOTE — Telephone Encounter (Signed)
Shelby Olsen called and states she delivered a baby " Shelby Olsen" on 18th and baby's body was sent to [facility] for autopsy.  Wants to get autopsy results. Called Shelby Olsen and informed her autopsy results take several weeks and will go to her doctor- can get the results at her postpartum visit.   Reviewed her postpartum appt date/time with her.

## 2013-07-20 ENCOUNTER — Encounter: Payer: Self-pay | Admitting: Obstetrics and Gynecology

## 2013-07-21 LAB — CHROMOSOME STD, POC(TISSUE)-NCBH

## 2013-07-26 DEATH — deceased

## 2013-08-16 ENCOUNTER — Ambulatory Visit (INDEPENDENT_AMBULATORY_CARE_PROVIDER_SITE_OTHER): Payer: Medicaid Other | Admitting: Obstetrics and Gynecology

## 2013-08-16 ENCOUNTER — Encounter: Payer: Self-pay | Admitting: Obstetrics and Gynecology

## 2013-08-16 VITALS — BP 138/93 | HR 78 | Temp 97.2°F | Wt 163.3 lb

## 2013-08-16 DIAGNOSIS — IMO0002 Reserved for concepts with insufficient information to code with codable children: Secondary | ICD-10-CM

## 2013-08-16 NOTE — Progress Notes (Signed)
Patient ID: Shelby Olsen Seen, female   DOB: [birth year], 17 y.o.   MRN: [id number] Pt. Is unsure of what she would like to do for birth control at this time. States she is considering the nexplanon but is still not sure-- information sheets about Mirena and other birth control options given.

## 2013-08-16 NOTE — Progress Notes (Signed)
  Subjective:     Shelby Olsen is a 17 y.o. female G2P2001who presents for a postpartum visit. She is 5 weeks postpartum following a spontaneous vaginal delivery IIUFD, unexplained. I have fully reviewed the prenatal and intrapartum course. The delivery was at 38.2 gestational weeks. Outcome: spontaneous vaginal delivery, no lacerations. Anesthesia: epidural. Postpartum course has been complicated by grief and loss. Bleeding no bleeding. Bowel function is normal. Bladder function is normal. Patient is not sexually active. Contraception method is abstinence.  Postpartum depression screening: score 9. Sleep and ADL functioning OK. Has support. Has appointment with [facility] for counseling. Family coming for Christmas. Anxious to get autopsy/chromosome report which is pending.     Review of Systems Pertinent items are noted in HPI.   Objective:    BP 138/93  Pulse 78  Temp(Src) 97.2 F (36.2 C) (Oral)  Wt 163 lb 4.8 oz (74.072 kg)  General:  alert, cooperative and no distress   Breasts:  inspection negative, no nipple discharge or bleeding, no masses or nodularity palpable  Lungs: clear to auscultation bilaterally  Heart:  regular rate and rhythm, S1, S2 normal, no murmur, click, rub or gallop                             Assessment:     5 wks postpartum exam. Pap smear not done at today's visit.  S/P unexplained term IUFD coping appropriately Borderline BP elevation  Plan:    1. Contraception: Encouraged LARC. Pt undecided and will call for apointment when she decides. 2. Encourage to continue in counseling as needed Will ask Clinic manager to call pt Monday re: forthcoming results.  Needs F/U 1-2 months for contraception and BP recheck.

## 2013-08-23 ENCOUNTER — Telehealth: Payer: Self-pay | Admitting: *Deleted

## 2013-08-23 NOTE — Telephone Encounter (Signed)
Left message with female that we should know results on Wednesday.

## 2014-06-23 ENCOUNTER — Encounter (HOSPITAL_COMMUNITY): Payer: Self-pay | Admitting: *Deleted

## 2014-06-23 ENCOUNTER — Inpatient Hospital Stay (HOSPITAL_COMMUNITY): Payer: Medicaid Other

## 2014-06-23 ENCOUNTER — Inpatient Hospital Stay (HOSPITAL_COMMUNITY)
Admission: AD | Admit: 2014-06-23 | Discharge: 2014-06-23 | Disposition: A | Discharge status: Home / Self Care | Payer: Medicaid Other | Source: Ambulatory Visit | Attending: Obstetrics & Gynecology | Admitting: Obstetrics & Gynecology

## 2014-06-23 DIAGNOSIS — N3000 Acute cystitis without hematuria: Secondary | ICD-10-CM

## 2014-06-23 DIAGNOSIS — Z3A15 15 weeks gestation of pregnancy: Secondary | ICD-10-CM | POA: Insufficient documentation

## 2014-06-23 DIAGNOSIS — O2342 Unspecified infection of urinary tract in pregnancy, second trimester: Secondary | ICD-10-CM | POA: Diagnosis not present

## 2014-06-23 DIAGNOSIS — B9689 Other specified bacterial agents as the cause of diseases classified elsewhere: Secondary | ICD-10-CM | POA: Diagnosis not present

## 2014-06-23 DIAGNOSIS — N760 Acute vaginitis: Secondary | ICD-10-CM | POA: Diagnosis not present

## 2014-06-23 DIAGNOSIS — O9989 Other specified diseases and conditions complicating pregnancy, childbirth and the puerperium: Secondary | ICD-10-CM

## 2014-06-23 DIAGNOSIS — R109 Unspecified abdominal pain: Secondary | ICD-10-CM

## 2014-06-23 DIAGNOSIS — O26899 Other specified pregnancy related conditions, unspecified trimester: Secondary | ICD-10-CM

## 2014-06-23 DIAGNOSIS — O26892 Other specified pregnancy related conditions, second trimester: Secondary | ICD-10-CM | POA: Insufficient documentation

## 2014-06-23 LAB — URINALYSIS, ROUTINE W REFLEX MICROSCOPIC
Bilirubin Urine: NEGATIVE
Glucose, UA: NEGATIVE mg/dL
Ketones, ur: 15 mg/dL — AB
Nitrite: POSITIVE — AB
Protein, ur: NEGATIVE mg/dL
SPECIFIC GRAVITY, URINE: 1.025 (ref 1.005–1.030)
Urobilinogen, UA: 0.2 mg/dL (ref 0.0–1.0)
pH: 6 (ref 5.0–8.0)

## 2014-06-23 LAB — URINE MICROSCOPIC-ADD ON

## 2014-06-23 LAB — WET PREP, GENITAL
Trich, Wet Prep: NONE SEEN
YEAST WET PREP: NONE SEEN

## 2014-06-23 MED ORDER — CEPHALEXIN 500 MG PO CAPS: 500.0000 mg | ORAL_CAPSULE | Freq: Four times a day (QID) | ORAL | Status: DC

## 2014-06-23 MED ORDER — METRONIDAZOLE 500 MG PO TABS: 500.0000 mg | ORAL_TABLET | Freq: Two times a day (BID) | ORAL | Status: DC

## 2014-06-23 NOTE — MAU Note (Signed)
Pt reports she had had lower abd pain on and off for a week. Noticed some blood when she wiped. Missed appointment today at health dept.

## 2014-06-23 NOTE — Discharge Instructions (Signed)
Bacterial Vaginosis °Bacterial vaginosis is an infection of the vagina. It happens when too many of certain germs (bacteria) grow in the vagina. °HOME CARE °· Take your medicine as told by your doctor. °· Finish your medicine even if you start to feel better. °· Do not have sex until you finish your medicine and are better. °· Tell your sex partner that you have an infection. They should see their doctor for treatment. °· Practice safe sex. Use condoms. Have only one sex partner. °GET HELP IF: °· You are not getting better after 3 days of treatment. °· You have more grey fluid (discharge) coming from your vagina than before. °· You have more pain than before. °· You have a fever. °MAKE SURE YOU:  °· Understand these instructions. °· Will watch your condition. °· Will get help right away if you are not doing well or get worse. °Document Released: 05/21/2008 Document Revised: 06/02/2013 Document Reviewed: 03/24/2013 °ExitCare® Patient Information ©2015 [employer]. This information is not intended to replace advice given to you by your health care provider. Make sure you discuss any questions you have with your health care provider. ° °Urinary Tract Infection °A urinary tract infection (UTI) can occur any place along the urinary tract. The tract includes the kidneys, ureters, bladder, and urethra. A type of germ called bacteria often causes a UTI. UTIs are often helped with antibiotic medicine.  °HOME CARE  °· If given, take antibiotics as told by your doctor. Finish them even if you start to feel better. °· Drink enough fluids to keep your pee (urine) clear or pale yellow. °· Avoid tea, drinks with caffeine, and bubbly (carbonated) drinks. °· Pee often. Avoid holding your pee in for a long time. °· Pee before and after having sex (intercourse). °· Wipe from front to back after you poop (bowel movement) if you are a woman. Use each tissue only once. °GET HELP RIGHT AWAY IF:  °· You have back pain. °· You have lower  belly (abdominal) pain. °· You have chills. °· You feel sick to your stomach (nauseous). °· You throw up (vomit). °· Your burning or discomfort with peeing does not go away. °· You have a fever. °· Your symptoms are not better in 3 days. °MAKE SURE YOU:  °· Understand these instructions. °· Will watch your condition. °· Will get help right away if you are not doing well or get worse. °Document Released: 01/29/2008 Document Revised: 05/06/2012 Document Reviewed: 03/12/2012 °ExitCare® Patient Information ©2015 [employer]. This information is not intended to replace advice given to you by your health care provider. Make sure you discuss any questions you have with your health care provider. ° °

## 2014-06-23 NOTE — MAU Provider Note (Signed)
Attestation of Attending Supervision of Advanced Practitioner (CNM/NP): Evaluation and management procedures were performed by the Advanced Practitioner under my supervision and collaboration.  I have reviewed the Advanced Practitioner's note and chart, and I agree with the management and plan.  HARRAWAY-SMITH, Lavern Crimi 7:39 PM

## 2014-06-23 NOTE — MAU Provider Note (Signed)
History     CSN: [id number]  Arrival date and time: 06/23/14 1441   First Provider Initiated Contact with Patient 06/23/14 1548      Chief Complaint  Patient presents with  . Abdominal Pain   HPI Shelby Olsen is a 18 y.o. G3P2001 at 3753w2d who presents to MAU today with complaint of lower abdominal and back pain. Pain x 2 weeks off and on, worse with ambulation and change or positions. The patient [facility] and missed it. She states spotting off and on x 1 week. She denies vaginal discharge, fever or recent intercourse. She states occasional mild N/V since early pregnancy. She states history of stillborn with last pregnancy at term, unknown cause.    OB History   Grav Para Term Preterm Abortions TAB SAB Ect Mult Living   3 2 2  0 0 0 0 0 0 1      Past Medical History  Diagnosis Date  . Migraine     Past Surgical History  Procedure Laterality Date  . No past surgeries      Family History  Problem Relation Age of Onset  . Hypertension Mother   . Asthma Sister   . Asthma Brother     History  Substance Use Topics  . Smoking status: Never Smoker   . Smokeless tobacco: Never Used  . Alcohol Use: No    Allergies:  Allergies  Allergen Reactions  . Peanuts [Peanut Oil] Anaphylaxis and Hives    No prescriptions prior to admission    Review of Systems  Constitutional: Negative for fever and malaise/fatigue.  Gastrointestinal: Positive for nausea, vomiting and abdominal pain. Negative for diarrhea and constipation.  Genitourinary: Negative for dysuria, urgency and frequency.       + vaginal bleeding Neg - vaginal discharge   Physical Exam   Blood pressure 123/81, pulse 103, temperature 99.2 F (37.3 C), temperature source Oral, resp. rate 18, height 5\' 2"  (1.575 m), weight 179 lb 3.2 oz (81.285 kg), last menstrual period 02/23/2014.  Physical Exam  Constitutional: She is oriented to person, place, and  time. She appears well-developed and well-nourished. No distress.  HENT:  Head: Normocephalic.  Cardiovascular: Normal rate.   Respiratory: Effort normal.  GI: Soft. She exhibits no distension and no mass. There is no tenderness. There is no rebound and no guarding.  Genitourinary: Uterus is enlarged (appropriate for GA). Uterus is not tender. Cervix exhibits no motion tenderness, no discharge and no friability. No bleeding around the vagina. Vaginal discharge (small amount of thin, white discharge noted) found.  Neurological: She is alert and oriented to person, place, and time.  Skin: Skin is warm and dry. No erythema.  Psychiatric: She has a normal mood and affect.   Results for orders placed during the hospital encounter of 06/23/14 (from the past 24 hour(s))  URINALYSIS, ROUTINE W REFLEX MICROSCOPIC     Status: Abnormal   Collection Time    06/23/14  1:00 PM      Result Value Ref Range   Color, Urine YELLOW  YELLOW   APPearance HAZY (*) CLEAR   Specific Gravity, Urine 1.025  1.005 - 1.030   pH 6.0  5.0 - 8.0   Glucose, UA NEGATIVE  NEGATIVE mg/dL   Hgb urine dipstick TRACE (*) NEGATIVE   Bilirubin Urine NEGATIVE  NEGATIVE   Ketones, ur 15 (*) NEGATIVE mg/dL   Protein, ur NEGATIVE  NEGATIVE mg/dL  Urobilinogen, UA 0.2  0.0 - 1.0 mg/dL   Nitrite POSITIVE (*) NEGATIVE   Leukocytes, UA MODERATE (*) NEGATIVE  URINE MICROSCOPIC-ADD ON     Status: Abnormal   Collection Time    06/23/14  1:00 PM      Result Value Ref Range   Squamous Epithelial / LPF FEW (*) RARE   WBC, UA 21-50  <3 WBC/hpf   Bacteria, UA MANY (*) RARE  WET PREP, GENITAL     Status: Abnormal   Collection Time    06/23/14  5:40 PM      Result Value Ref Range   Yeast Wet Prep HPF POC NONE SEEN  NONE SEEN   Trich, Wet Prep NONE SEEN  NONE SEEN   Clue Cells Wet Prep HPF POC MANY (*) NONE SEEN   WBC, Wet Prep HPF POC MODERATE (*) NONE SEEN    MAU Course  Procedures None  MDM FHR - 165 bpm with doppler UA,  wet prep and US today Preliminary US shows no previa or abruption, normal placenta, AFI and cervical length Assessment and Plan  A: SIUP at 3667w2d UTI in pregnancy Bacterial vaginosis  P: Discharge home Rx for Keflex and Flagyl given to patient Patient advised to increased PO hydration Second trimester and pyelonephritis warning signs discussed Patient advised to reschedule appointment with [facility] to start prenatal care Patient may return to MAU as needed or if her condition were to change or worsen  Marny LowensteinJulie N Wenzel, PA-C  06/23/2014, 6:18 PM

## 2014-06-27 ENCOUNTER — Encounter (HOSPITAL_COMMUNITY): Payer: Self-pay | Admitting: *Deleted

## 2014-06-29 LAB — OB RESULTS CONSOLE HIV ANTIBODY (ROUTINE TESTING): HIV: NONREACTIVE

## 2014-06-29 LAB — OB RESULTS CONSOLE GC/CHLAMYDIA
Chlamydia: NEGATIVE
Gonorrhea: NEGATIVE

## 2014-06-29 LAB — OB RESULTS CONSOLE ABO/RH: RH TYPE: POSITIVE

## 2014-06-29 LAB — OB RESULTS CONSOLE HEPATITIS B SURFACE ANTIGEN: Hepatitis B Surface Ag: NEGATIVE

## 2014-06-29 LAB — OB RESULTS CONSOLE ANTIBODY SCREEN: Antibody Screen: NEGATIVE

## 2014-06-29 LAB — OB RESULTS CONSOLE RPR: RPR: NONREACTIVE

## 2014-06-29 LAB — OB RESULTS CONSOLE RUBELLA ANTIBODY, IGM: Rubella: IMMUNE

## 2014-08-26 NOTE — L&D Delivery Note (Signed)
Delivery Note At 8:57 AM a viable female, "Ka' Rion", was delivered via Vaginal, Spontaneous Delivery (Presentation: Middle Occiput Posterior).  APGAR: 8, 9; weight  .   Placenta status: Intact, Spontaneous.  Cord: 3 vessels with the following complications: None.  Cord pH: NA. End-stage bradycardia noted just prior to delivery.    Anesthesia: Epidural  Episiotomy: None Lacerations: 1st degree right periurethral. Suture Repair: 3.0 vicryl Est. Blood Loss (mL): 250 Placenta to [facility] staff for cord blood donation.  Right vaginal wall cyst intact after delivery, slight bruising over area, but no evidence of hematoma formation.  Mom to postpartum.  Baby to Couplet care / Skin to Skin. Patient plans breast feeding while in hospital, breast and bottle after d/c. Plans outpatient circumcision. Desires Nexplanon for contraception.  PCR negative at 0.19.   Woods Gangemi 12/08/2014, 9:20 AM

## 2014-11-21 LAB — OB RESULTS CONSOLE GBS: GBS: NEGATIVE

## 2014-11-28 ENCOUNTER — Inpatient Hospital Stay (HOSPITAL_COMMUNITY): Payer: Medicaid Other

## 2014-11-28 ENCOUNTER — Inpatient Hospital Stay (HOSPITAL_COMMUNITY)
Admission: AD | Admit: 2014-11-28 | Discharge: 2014-11-28 | Disposition: A | Discharge status: Home / Self Care | Payer: Medicaid Other | Source: Ambulatory Visit | Attending: Obstetrics & Gynecology | Admitting: Obstetrics & Gynecology

## 2014-11-28 ENCOUNTER — Other Ambulatory Visit: Payer: Self-pay | Admitting: Obstetrics and Gynecology

## 2014-11-28 ENCOUNTER — Encounter (HOSPITAL_COMMUNITY): Payer: Self-pay | Admitting: *Deleted

## 2014-11-28 DIAGNOSIS — R030 Elevated blood-pressure reading, without diagnosis of hypertension: Secondary | ICD-10-CM | POA: Diagnosis present

## 2014-11-28 DIAGNOSIS — Z3A37 37 weeks gestation of pregnancy: Secondary | ICD-10-CM | POA: Insufficient documentation

## 2014-11-28 DIAGNOSIS — O0933 Supervision of pregnancy with insufficient antenatal care, third trimester: Secondary | ICD-10-CM | POA: Insufficient documentation

## 2014-11-28 LAB — COMPREHENSIVE METABOLIC PANEL
ALK PHOS: 174 U/L — AB (ref 39–117)
ALT: 17 U/L (ref 0–35)
AST: 18 U/L (ref 0–37)
Albumin: 3.1 g/dL — ABNORMAL LOW (ref 3.5–5.2)
Anion gap: 8 (ref 5–15)
BUN: 7 mg/dL (ref 6–23)
CALCIUM: 9.2 mg/dL (ref 8.4–10.5)
CO2: 19 mmol/L (ref 19–32)
Chloride: 110 mmol/L (ref 96–112)
Creatinine, Ser: 0.46 mg/dL — ABNORMAL LOW (ref 0.50–1.10)
GFR calc Af Amer: >90 mL/min (ref >90)
GFR calc non Af Amer: >90 mL/min (ref >90)
Glucose, Bld: 78 mg/dL (ref 70–99)
POTASSIUM: 3.8 mmol/L (ref 3.5–5.1)
Sodium: 137 mmol/L (ref 135–145)
TOTAL PROTEIN: 7.2 g/dL (ref 6.0–8.3)
Total Bilirubin: 0.4 mg/dL (ref 0.3–1.2)

## 2014-11-28 LAB — CBC
HCT: 37.1 % (ref 36.0–46.0)
HEMOGLOBIN: 12.1 g/dL (ref 12.0–15.0)
MCH: 27 pg (ref 26.0–34.0)
MCHC: 32.6 g/dL (ref 30.0–36.0)
MCV: 82.8 fL (ref 78.0–100.0)
Platelets: 218 10*3/uL (ref 150–400)
RBC: 4.48 MIL/uL (ref 3.87–5.11)
RDW: 13.7 % (ref 11.5–15.5)
WBC: 12.9 10*3/uL — ABNORMAL HIGH (ref 4.0–10.5)

## 2014-11-28 LAB — PROTEIN / CREATININE RATIO, URINE
CREATININE, URINE: 52 mg/dL
PROTEIN CREATININE RATIO: 0.17 — AB (ref 0.00–0.15)
Total Protein, Urine: 9 mg/dL

## 2014-11-28 LAB — LACTATE DEHYDROGENASE: LDH: 119 U/L (ref 94–250)

## 2014-11-28 LAB — URIC ACID: URIC ACID, SERUM: 4 mg/dL (ref 2.4–7.0)

## 2014-11-28 MED ORDER — LACTATED RINGERS IV BOLUS (SEPSIS): 1000.0000 mL | Freq: Once | INTRAVENOUS | Status: DC

## 2014-11-28 NOTE — Discharge Instructions (Signed)

## 2014-11-28 NOTE — MAU Note (Signed)
PT  SAYS SHE HAD REG  APPOINTMENT  IN OFFICE  THIS AM-   BP ELEVATED  AND 3 CM,  AND UC -  SO THEY  SENT  HER  HERE.      PT SAYS UC  STARTED HURTING  AT 0500.      DENIES HSV AND MRSA.  GBS- UNSURE

## 2014-11-28 NOTE — MAU Provider Note (Signed)
MAU Addendum Note  Results for orders placed or performed during the hospital encounter of 11/28/14 (from the past 24 hour(s))  Protein / creatinine ratio, urine     Status: Abnormal   Collection Time: 11/28/14 11:53 AM  Result Value Ref Range   Creatinine, Urine 52.00 mg/dL   Total Protein, Urine 9 mg/dL   Protein Creatinine Ratio 0.17 (H) 0.00 - 0.15  CBC     Status: Abnormal   Collection Time: 11/28/14 12:54 PM  Result Value Ref Range   WBC 12.9 (H) 4.0 - 10.5 K/uL   RBC 4.48 3.87 - 5.11 MIL/uL   Hemoglobin 12.1 12.0 - 15.0 g/dL   HCT 16.137.1 09.636.0 - 04.546.0 %   MCV 82.8 78.0 - 100.0 fL   MCH 27.0 26.0 - 34.0 pg   MCHC 32.6 30.0 - 36.0 g/dL   RDW 40.913.7 81.111.5 - 91.415.5 %   Platelets 218 150 - 400 K/uL  Comprehensive metabolic panel     Status: Abnormal   Collection Time: 11/28/14 12:54 PM  Result Value Ref Range   Sodium 137 135 - 145 mmol/L   Potassium 3.8 3.5 - 5.1 mmol/L   Chloride 110 96 - 112 mmol/L   CO2 19 19 - 32 mmol/L   Glucose, Bld 78 70 - 99 mg/dL   BUN 7 6 - 23 mg/dL   Creatinine, Ser 7.820.46 (L) 0.50 - 1.10 mg/dL   Calcium 9.2 8.4 - 95.610.5 mg/dL   Total Protein 7.2 6.0 - 8.3 g/dL   Albumin 3.1 (L) 3.5 - 5.2 g/dL   AST 18 0 - 37 U/L   ALT 17 0 - 35 U/L   Alkaline Phosphatase 174 (H) 39 - 117 U/L   Total Bilirubin 0.4 0.3 - 1.2 mg/dL   GFR calc non Af Amer >90 >90 mL/min   GFR calc Af Amer >90 >90 mL/min   Anion gap 8 5 - 15  Lactate dehydrogenase     Status: None   Collection Time: 11/28/14 12:54 PM  Result Value Ref Range   LDH 119 94 - 250 U/L  Uric acid     Status: None   Collection Time: 11/28/14 12:54 PM  Result Value Ref Range   Uric Acid, Serum 4.0 2.4 - 7.0 mg/dL     Plan: Continue to monitor    Levi StraussVenus Kenshin Splawn, CNM, MSN 11/28/2014. 1:38 PM

## 2014-11-28 NOTE — MAU Note (Signed)
Urine in lab 

## 2014-11-28 NOTE — MAU Provider Note (Signed)
Shelby Olsen is a 19 y.o. G3P2001 at 37.6 came from the office with c/o ctx weeks and elevated BP.  She has a hx of full term IUFU just prior to pushing in 2014.   History     Patient Active Problem List   Diagnosis Date Noted  . IUFD (intrauterine fetal death) 08/16/2013  . Insufficient prenatal care 05/26/2013  . Late prenatal care complicating pregnancy in second trimester 02/17/2013  . UTI (lower urinary tract infection) 02/17/2013  . Hyperemesis complicating pregnancy, antepartum 12/22/2012    No chief complaint on file.  HPI  OB History    Gravida Para Term Preterm AB TAB SAB Ectopic Multiple Living   3 2 2  0 0 0 0 0 0 1      Past Medical History  Diagnosis Date  . Migraine     Past Surgical History  Procedure Laterality Date  . No past surgeries      Family History  Problem Relation Age of Onset  . Hypertension Mother   . Asthma Sister   . Asthma Brother     History  Substance Use Topics  . Smoking status: Never Smoker   . Smokeless tobacco: Never Used  . Alcohol Use: No    Allergies:  Allergies  Allergen Reactions  . Peanuts [Peanut Oil] Anaphylaxis and Hives    Prescriptions prior to admission  Medication Sig Dispense Refill Last Dose  . OVER THE COUNTER MEDICATION Take 1 tablet by mouth 2 (two) times daily. Patient takes Gummy Prenatal Vitamins twice daily.   11/28/2014 at Unknown time  . cephALEXin (KEFLEX) 500 MG capsule Take 1 capsule (500 mg total) by mouth 4 (four) times daily. (Patient not taking: Reported on 11/28/2014) 20 capsule 0 Completed Course at Unknown time  . metroNIDAZOLE (FLAGYL) 500 MG tablet Take 1 tablet (500 mg total) by mouth 2 (two) times daily. (Patient not taking: Reported on 11/28/2014) 14 tablet 0 Completed Course at Unknown time    ROS See HPI above, all other systems are negative  Physical Exam   Blood pressure 121/77, pulse 107, temperature 98.2 F (36.8 C), temperature source Oral, resp. rate 18, height 4'  11" (1.499 m), weight 197 lb 6 oz (89.529 kg), last menstrual period 02/23/2014. Filed Vitals:   11/28/14 1215 11/28/14 1230 11/28/14 1256 11/28/14 1257  BP: 127/79 120/74 121/77 121/77  Pulse: 100 99 107 107  Temp:      TempSrc:      Resp:      Height:      Weight:       Physical Exam Ext:  WNL ABD: Soft, non tender to palpation, no rebound or guarding SVE: 1-2 in ternal os and 3 external os 1cm cyst right labia   ED Course  Assessment: IUP at  37.3weeks Membranes: intact FHR: Category 2, 145, moderate variability, + accel, occasional variable decel.   CTX:  3-4 minutes   Plan: Labs: PIH labs US BPP   Rosemary Pentecost, CNM, MSN 11/28/2014. 1:01 PM

## 2014-11-28 NOTE — MAU Provider Note (Signed)
MAU Addendum Note  Results for orders placed or performed during the hospital encounter of 11/28/14 (from the past 24 hour(s))  Protein / creatinine ratio, urine     Status: Abnormal   Collection Time: 11/28/14 11:53 AM  Result Value Ref Range   Creatinine, Urine 52.00 mg/dL   Total Protein, Urine 9 mg/dL   Protein Creatinine Ratio 0.17 (H) 0.00 - 0.15  CBC     Status: Abnormal   Collection Time: 11/28/14 12:54 PM  Result Value Ref Range   WBC 12.9 (H) 4.0 - 10.5 K/uL   RBC 4.48 3.87 - 5.11 MIL/uL   Hemoglobin 12.1 12.0 - 15.0 g/dL   HCT 04.537.1 40.936.0 - 81.146.0 %   MCV 82.8 78.0 - 100.0 fL   MCH 27.0 26.0 - 34.0 pg   MCHC 32.6 30.0 - 36.0 g/dL   RDW 91.413.7 78.211.5 - 95.615.5 %   Platelets 218 150 - 400 K/uL  Comprehensive metabolic panel     Status: Abnormal   Collection Time: 11/28/14 12:54 PM  Result Value Ref Range   Sodium 137 135 - 145 mmol/L   Potassium 3.8 3.5 - 5.1 mmol/L   Chloride 110 96 - 112 mmol/L   CO2 19 19 - 32 mmol/L   Glucose, Bld 78 70 - 99 mg/dL   BUN 7 6 - 23 mg/dL   Creatinine, Ser 2.130.46 (L) 0.50 - 1.10 mg/dL   Calcium 9.2 8.4 - 08.610.5 mg/dL   Total Protein 7.2 6.0 - 8.3 g/dL   Albumin 3.1 (L) 3.5 - 5.2 g/dL   AST 18 0 - 37 U/L   ALT 17 0 - 35 U/L   Alkaline Phosphatase 174 (H) 39 - 117 U/L   Total Bilirubin 0.4 0.3 - 1.2 mg/dL   GFR calc non Af Amer >90 >90 mL/min   GFR calc Af Amer >90 >90 mL/min   Anion gap 8 5 - 15  Lactate dehydrogenase     Status: None   Collection Time: 11/28/14 12:54 PM  Result Value Ref Range   LDH 119 94 - 250 U/L  Uric acid     Status: None   Collection Time: 11/28/14 12:54 PM  Result Value Ref Range   Uric Acid, Serum 4.0 2.4 - 7.0 mg/dL   FHT 578130, moderate variability, + accel, no decel  Plan: -Discussed need to follow up in office  -Bleeding and Labor Precautions -Kick counts -Encouraged to call if any questions or concerns arise prior to next scheduled office visit.  -Discharged to home in one hour is no variables and  category 1 strip Consulted with Dr. Burna FortsKulwa   Dhriti Fales, CNM, MSN 11/28/2014. 5:35 PM

## 2014-11-28 NOTE — MAU Provider Note (Signed)
MAU Addendum Note  US BPP 8/8, AFI 19.84  Results for orders placed or performed during the hospital encounter of 11/28/14 (from the past 24 hour(s))  Protein / creatinine ratio, urine     Status: Abnormal   Collection Time: 11/28/14 11:53 AM  Result Value Ref Range   Creatinine, Urine 52.00 mg/dL   Total Protein, Urine 9 mg/dL   Protein Creatinine Ratio 0.17 (H) 0.00 - 0.15  CBC     Status: Abnormal   Collection Time: 11/28/14 12:54 PM  Result Value Ref Range   WBC 12.9 (H) 4.0 - 10.5 K/uL   RBC 4.48 3.87 - 5.11 MIL/uL   Hemoglobin 12.1 12.0 - 15.0 g/dL   HCT 16.137.1 09.636.0 - 04.546.0 %   MCV 82.8 78.0 - 100.0 fL   MCH 27.0 26.0 - 34.0 pg   MCHC 32.6 30.0 - 36.0 g/dL   RDW 40.913.7 81.111.5 - 91.415.5 %   Platelets 218 150 - 400 K/uL  Comprehensive metabolic panel     Status: Abnormal   Collection Time: 11/28/14 12:54 PM  Result Value Ref Range   Sodium 137 135 - 145 mmol/L   Potassium 3.8 3.5 - 5.1 mmol/L   Chloride 110 96 - 112 mmol/L   CO2 19 19 - 32 mmol/L   Glucose, Bld 78 70 - 99 mg/dL   BUN 7 6 - 23 mg/dL   Creatinine, Ser 7.820.46 (L) 0.50 - 1.10 mg/dL   Calcium 9.2 8.4 - 95.610.5 mg/dL   Total Protein 7.2 6.0 - 8.3 g/dL   Albumin 3.1 (L) 3.5 - 5.2 g/dL   AST 18 0 - 37 U/L   ALT 17 0 - 35 U/L   Alkaline Phosphatase 174 (H) 39 - 117 U/L   Total Bilirubin 0.4 0.3 - 1.2 mg/dL   GFR calc non Af Amer >90 >90 mL/min   GFR calc Af Amer >90 >90 mL/min   Anion gap 8 5 - 15  Lactate dehydrogenase     Status: None   Collection Time: 11/28/14 12:54 PM  Result Value Ref Range   LDH 119 94 - 250 U/L  Uric acid     Status: None   Collection Time: 11/28/14 12:54 PM  Result Value Ref Range   Uric Acid, Serum 4.0 2.4 - 7.0 mg/dL     Plan:  Per Dr Sallye OberKulwa, IVF, 2 hour observation   Virga Haltiwanger, CNM, MSN 11/28/2014. 4:27 PM

## 2014-11-29 ENCOUNTER — Encounter (HOSPITAL_COMMUNITY): Payer: Self-pay | Admitting: *Deleted

## 2014-11-29 ENCOUNTER — Telehealth (HOSPITAL_COMMUNITY): Payer: Self-pay | Admitting: *Deleted

## 2014-11-29 NOTE — Telephone Encounter (Signed)
Preadmission screen  

## 2014-12-06 DIAGNOSIS — Z8759 Personal history of other complications of pregnancy, childbirth and the puerperium: Secondary | ICD-10-CM | POA: Insufficient documentation

## 2014-12-06 DIAGNOSIS — O0930 Supervision of pregnancy with insufficient antenatal care, unspecified trimester: Secondary | ICD-10-CM | POA: Insufficient documentation

## 2014-12-06 DIAGNOSIS — Z6839 Body mass index (BMI) 39.0-39.9, adult: Secondary | ICD-10-CM | POA: Insufficient documentation

## 2014-12-06 DIAGNOSIS — IMO0002 Reserved for concepts with insufficient information to code with codable children: Secondary | ICD-10-CM

## 2014-12-06 HISTORY — DX: Reserved for concepts with insufficient information to code with codable children: IMO0002

## 2014-12-06 NOTE — H&P (Signed)
Shelby Olsen is a 19 y.o. female, G3P2001 at 39.1 weeks, presenting for IOL due to IUFD at 39.2 weeks in 2014. +FM. Denies ctxs, VB, LOF, h/a, visual disturbances, RUQ pain, CP, SOB, weakness or N/V.  Undecided if will have medicated or un-medicated birth.   Patient Active Problem List   Diagnosis Date Noted  . Normal labor 12/07/2014  . History of migraine headaches - no meds 12/07/2014  . Irregular periods/menstrual cycles 12/07/2014  . Short stature 12/07/2014  . Insufficient prenatal care 12/06/2014  . IUFD (intrauterine fetal death) - in previous pregnancy 12/06/2014  . BMI 39.0-39.9,adult 12/06/2014  . History of measles as a child 12/06/2014  . Late prenatal care 12/06/2014    History of present pregnancy: Patient entered care at 17.1 weeks.   EDC of 12/13/14 was established by 15.2 wk scan. 15.2 wks (MAU) due to bleeding and cramping: Cervical length: 3.93 cm, Cvx: normal appearance by transabdominal scan, Uterus: no abnormality visualized, L ovary: L 3.16 x W 2.22 x H 2.18, volume 8 ml, R ovary: not visualized, Adnexa: no abnormality visualized. Adnexa: no abnormality visualized, no adnexal mass visualized.  17.1 wks for dating (office scan): Anteverted uterus, singleton pregnancy, Breech presentation, normal fluid; vertical pocket = 3.8 cm, cvx closed, ovaries and adnexas WNLs, dates concordant. Anatomy scan: 20 weeks, with normal findings, no previa.   Additional [place] evaluations: 37.6 wks due to Cat 2 tracing in MAU: BPP 8/8, AFI 19.84 cm.   Significant prenatal events: Late entry to care. UTI (e. Coli) at 17.1 wks - treated w/ Macrobid, no TOC performed; sciatica and ongoing back pain - declined ortho and PT evals - encouraged exercises and stretching; URI at 31+ wks - rec supportive care. History of IUFD at 39.2 wks in 2014, declined MFM visit. Had bi-weekly NSTs beginning at 36+ wks; all Cat 1 tracings. Reported regular ctxs and LOF at 37.6 wks - Cat 1 tracing, +ctxs, SSE  neg for ROM - to MAU for c/o h/a and elevated BP - Cat 2 tracing, sent for BPP (8/8), given IVFs and observed x 2 hrs, normal preeclampsia labs and urine PCR, cvx 1-2 cm internal os, 3 cm external os. TWG 11 lbs. Declined flu vaccine and Tdap. Insufficient prenatal care. Last evaluation: Office 11/28/14 @ 37.6 wks by VL, CNM. FHR 140 bpm. Cvx 3/80/-3 (difficult exam). BP 128/98, repeat 122/88.  OB History    Gravida Para Term Preterm AB TAB SAB Ectopic Multiple Living   0 0 0 0 0 0 1    SVD on 06/10/2011 @ 40 wks, 15-hr labor, female infant, birthwt 5lbs 8 oz, Epidural, [facility] "Shelby Olsen" IUFD on 07/13/2013 @ 39.2 wks, 1-hr labor, female infant, birthwt 5lbs 9 oz, no anesthesia/analgesia, [facility] "Shelby Olsen" Past Medical History  Diagnosis Date  . Migraine   . Hx of measles    Past Surgical History  Procedure Laterality Date  . No past surgeries     Family History: family history includes Asthma in her brother and sister; Hypertension in her mother. Social History:  reports that she has never smoked. She has never used smokeless tobacco. She reports that she does not drink alcohol or use illicit drugs.Pt is a single African-American female with a 12th grade education and employed as a Conservation officer, nature. She has no religious preference. FOB Cedric Bush involved.   Prenatal Transfer Tool  Maternal Diabetes: No Genetic Screening: Missed window Maternal Ultrasounds/Referrals: Normal Fetal Ultrasounds or other Referrals:  Other: Declined MFM  consultation Maternal Substance Abuse:  No Significant Maternal Medications:  Meds include: Other: PNVs Significant Maternal Lab Results: Lab values include: Group B Strep negative  TDAP: Declined Flu: Declined  ROS: +FM, -Ctxs, -VB, -LOF  Allergies  Allergen Reactions  . Peanuts [Peanut Oil] Anaphylaxis and Hives     Dilation: 4 Effacement (%): 100 Station: -2 Exam by:: Sherre ScarletKimberly Detrice Cales, CNM Blood pressure 134/82, pulse 105, temperature 98.1 F (36.7 C),  temperature source Oral, resp. rate 16, height 4\' 11"  (1.499 m), weight 197 lb (89.359 kg), last menstrual period 02/23/2014, SpO2 98 %.  Chest clear Heart RRR without murmur Abd gravid, NT, FH CWD Pelvic: 1/50/-3/soft/posterior Cephalic by Leopolds and VE Bishop score: 5 EFW: 6lbs Ext: DTRs 2+ bilaterally, no clonus, no edema  FHR: BL 150-162 bpm w/ mod-marked variability, +accels but do not meet criteria for reactivity. Mild variables noted. UCs: Irregular, q 6-9 min, palpate mild  Prenatal labs: ABO, Rh: --/--/A POS (04/13 1955) Antibody: NEG (04/13 1955) Rubella: Immune  RPR: Non Reactive (04/13 1955)  HBsAg: Negative (11/04 0000)  HIV: Non-reactive (11/04 0000)  GBS: Negative (03/28 0000) Sickle cell/Hgb electrophoresis: AA Pap: NA GC: Neg on 06/29/14 and 11/21/14 Chlamydia: Neg on 06/29/14 and 11/21/14 Genetic screenings: Missed window Glucola: Normal at 70 Other: UTI on 06/29/14, TOC not performed; VZV immune  Initial Hgb 11.7, 12.5 at 28 weeks   Assessment: IUP at 39.1 wks IOL due to IUFD at 39.2 wks in 2014 GBS neg Late entry to care Lapse in care Unfavorable cervix Elevated BP at last office visit with normal preeclampsia labs/urine PCR Elevated BMI (39.8) Short stature Maternal Tachycardia Cat 2 FHRT  [facility] Dr[facility] Charlotta Newtonzan. Routine [facility] orders. IVF bolus for periodic FHR changes. Intrauterine resuscitative measures prn. Baseline preeclampsia labs/urine PCR with admission labs. Pain med/epidural prn. Reviewed R&B of induction with patient and partner, including need for serial induction, risk of C/S, need for further intervention. Patient and partner seem to understand these risks and are agreeable with proceeding.  Discussed method of induction to include foley bulb and low dose Pitocin (with Cat 1 tracing). Pt in agreement. Regular diet prior to initiating IOL.  Consult prn. Expect SVD.   Sherre ScarletWILLIAMS, Lula Michaux CNM,  MS 12/07/14, 8:50 PM

## 2014-12-07 ENCOUNTER — Encounter (HOSPITAL_COMMUNITY): Payer: Self-pay

## 2014-12-07 ENCOUNTER — Inpatient Hospital Stay (HOSPITAL_COMMUNITY)
Admission: RE | Admit: 2014-12-07 | Discharge: 2014-12-10 | DRG: 775 | Disposition: A | Discharge status: Home / Self Care | Payer: Medicaid Other | Source: Ambulatory Visit | Attending: Obstetrics and Gynecology | Admitting: Obstetrics and Gynecology

## 2014-12-07 VITALS — BP 118/62 | HR 100 | Temp 98.3°F | Resp 18 | Ht 59.0 in | Wt 197.0 lb

## 2014-12-07 DIAGNOSIS — R6252 Short stature (child): Secondary | ICD-10-CM

## 2014-12-07 DIAGNOSIS — Z8669 Personal history of other diseases of the nervous system and sense organs: Secondary | ICD-10-CM

## 2014-12-07 DIAGNOSIS — Z6835 Body mass index (BMI) 35.0-35.9, adult: Secondary | ICD-10-CM

## 2014-12-07 DIAGNOSIS — Z3483 Encounter for supervision of other normal pregnancy, third trimester: Secondary | ICD-10-CM | POA: Diagnosis present

## 2014-12-07 DIAGNOSIS — Z8619 Personal history of other infectious and parasitic diseases: Secondary | ICD-10-CM

## 2014-12-07 DIAGNOSIS — O0932 Supervision of pregnancy with insufficient antenatal care, second trimester: Secondary | ICD-10-CM

## 2014-12-07 DIAGNOSIS — Z3A39 39 weeks gestation of pregnancy: Secondary | ICD-10-CM | POA: Diagnosis present

## 2014-12-07 DIAGNOSIS — N926 Irregular menstruation, unspecified: Secondary | ICD-10-CM

## 2014-12-07 LAB — LACTATE DEHYDROGENASE: LDH: 125 U/L (ref 94–250)

## 2014-12-07 LAB — CBC
HCT: 35.2 % — ABNORMAL LOW (ref 36.0–46.0)
Hemoglobin: 11.8 g/dL — ABNORMAL LOW (ref 12.0–15.0)
MCH: 27.8 pg (ref 26.0–34.0)
MCHC: 33.5 g/dL (ref 30.0–36.0)
MCV: 82.8 fL (ref 78.0–100.0)
PLATELETS: 227 10*3/uL (ref 150–400)
RBC: 4.25 MIL/uL (ref 3.87–5.11)
RDW: 13.8 % (ref 11.5–15.5)
WBC: 10.6 10*3/uL — ABNORMAL HIGH (ref 4.0–10.5)

## 2014-12-07 LAB — COMPREHENSIVE METABOLIC PANEL
ALT: 14 U/L (ref 0–35)
AST: 16 U/L (ref 0–37)
Albumin: 3.3 g/dL — ABNORMAL LOW (ref 3.5–5.2)
Alkaline Phosphatase: 190 U/L — ABNORMAL HIGH (ref 39–117)
Anion gap: 9 (ref 5–15)
BUN: 10 mg/dL (ref 6–23)
CHLORIDE: 107 mmol/L (ref 96–112)
CO2: 18 mmol/L — AB (ref 19–32)
Calcium: 8.9 mg/dL (ref 8.4–10.5)
Creatinine, Ser: 0.44 mg/dL — ABNORMAL LOW (ref 0.50–1.10)
GFR calc Af Amer: >90 mL/min (ref >90)
Glucose, Bld: 90 mg/dL (ref 70–99)
Potassium: 3.4 mmol/L — ABNORMAL LOW (ref 3.5–5.1)
Sodium: 134 mmol/L — ABNORMAL LOW (ref 135–145)
Total Bilirubin: 0.3 mg/dL (ref 0.3–1.2)
Total Protein: 7.3 g/dL (ref 6.0–8.3)

## 2014-12-07 LAB — TYPE AND SCREEN
ABO/RH(D): A POS
Antibody Screen: NEGATIVE

## 2014-12-07 LAB — URIC ACID: Uric Acid, Serum: 4.6 mg/dL (ref 2.4–7.0)

## 2014-12-07 MED ORDER — TERBUTALINE SULFATE 1 MG/ML IJ SOLN: 0.2500 mg | Freq: Once | INTRAMUSCULAR | Status: AC | PRN

## 2014-12-07 MED ORDER — LACTATED RINGERS IV SOLN
500.0000 mL | Freq: Once | INTRAVENOUS | Status: AC
  Administered 2014-12-08: 500 mL via INTRAVENOUS

## 2014-12-07 MED ORDER — ONDANSETRON HCL 4 MG/2ML IJ SOLN: 4.0000 mg | Freq: Four times a day (QID) | INTRAMUSCULAR | Status: DC | PRN

## 2014-12-07 MED ORDER — PHENYLEPHRINE 40 MCG/ML (10ML) SYRINGE FOR IV PUSH (FOR BLOOD PRESSURE SUPPORT)
80.0000 ug | PREFILLED_SYRINGE | INTRAVENOUS | Status: DC | PRN
  Filled 2014-12-07: qty 2

## 2014-12-07 MED ORDER — OXYCODONE-ACETAMINOPHEN 5-325 MG PO TABS: 1.0000 | ORAL_TABLET | ORAL | Status: DC | PRN

## 2014-12-07 MED ORDER — FENTANYL 2.5 MCG/ML BUPIVACAINE 1/10 % EPIDURAL INFUSION (WH - ANES)
14.0000 mL/h | INTRAMUSCULAR | Status: DC | PRN
  Administered 2014-12-08 (×2): 14 mL/h via EPIDURAL
  Filled 2014-12-07: qty 125

## 2014-12-07 MED ORDER — OXYTOCIN 40 UNITS IN LACTATED RINGERS INFUSION - SIMPLE MED
1.0000 m[IU]/min | INTRAVENOUS | Status: DC
  Administered 2014-12-07: 1 m[IU]/min via INTRAVENOUS

## 2014-12-07 MED ORDER — EPHEDRINE 5 MG/ML INJ
10.0000 mg | INTRAVENOUS | Status: DC | PRN
  Filled 2014-12-07: qty 2

## 2014-12-07 MED ORDER — OXYTOCIN BOLUS FROM INFUSION
500.0000 mL | INTRAVENOUS | Status: DC
  Administered 2014-12-08: 500 mL via INTRAVENOUS

## 2014-12-07 MED ORDER — LIDOCAINE HCL (PF) 1 % IJ SOLN
30.0000 mL | INTRAMUSCULAR | Status: DC | PRN
  Filled 2014-12-07: qty 30

## 2014-12-07 MED ORDER — CITRIC ACID-SODIUM CITRATE 334-500 MG/5ML PO SOLN: 30.0000 mL | ORAL | Status: DC | PRN

## 2014-12-07 MED ORDER — ACETAMINOPHEN 325 MG PO TABS: 650.0000 mg | ORAL_TABLET | ORAL | Status: DC | PRN

## 2014-12-07 MED ORDER — HYDROXYZINE HCL 50 MG PO TABS
50.0000 mg | ORAL_TABLET | Freq: Four times a day (QID) | ORAL | Status: DC | PRN
  Filled 2014-12-07: qty 1

## 2014-12-07 MED ORDER — LACTATED RINGERS IV SOLN
INTRAVENOUS | Status: DC
  Administered 2014-12-07 – 2014-12-08 (×5): via INTRAVENOUS

## 2014-12-07 MED ORDER — PHENYLEPHRINE 40 MCG/ML (10ML) SYRINGE FOR IV PUSH (FOR BLOOD PRESSURE SUPPORT)
80.0000 ug | PREFILLED_SYRINGE | INTRAVENOUS | Status: DC | PRN
  Filled 2014-12-07: qty 2
  Filled 2014-12-07: qty 20

## 2014-12-07 MED ORDER — LACTATED RINGERS IV SOLN
500.0000 mL | INTRAVENOUS | Status: DC | PRN
  Administered 2014-12-08: 500 mL via INTRAVENOUS
  Administered 2014-12-08: 250 mL via INTRAVENOUS

## 2014-12-07 MED ORDER — OXYTOCIN 40 UNITS IN LACTATED RINGERS INFUSION - SIMPLE MED: 62.5000 mL/h | INTRAVENOUS | Status: DC

## 2014-12-07 MED ORDER — OXYCODONE-ACETAMINOPHEN 5-325 MG PO TABS: 2.0000 | ORAL_TABLET | ORAL | Status: DC | PRN

## 2014-12-07 MED ORDER — DIPHENHYDRAMINE HCL 50 MG/ML IJ SOLN: 12.5000 mg | INTRAMUSCULAR | Status: DC | PRN

## 2014-12-07 MED ORDER — NALBUPHINE HCL 10 MG/ML IJ SOLN
10.0000 mg | INTRAMUSCULAR | Status: DC | PRN
  Administered 2014-12-07 – 2014-12-08 (×2): 10 mg via INTRAVENOUS
  Filled 2014-12-07 (×2): qty 1

## 2014-12-07 MED ORDER — OXYTOCIN 40 UNITS IN LACTATED RINGERS INFUSION - SIMPLE MED
1.0000 m[IU]/min | INTRAVENOUS | Status: DC
  Filled 2014-12-07: qty 1000

## 2014-12-07 MED ORDER — FLEET ENEMA 7-19 GM/118ML RE ENEM: 1.0000 | ENEMA | RECTAL | Status: DC | PRN

## 2014-12-07 NOTE — Progress Notes (Addendum)
  Subjective: Ate light meal, ready to proceed w/ induction. Still not feeling ctxs. FOB and couple's son at bedside.   Objective: BP 132/73 mmHg  Pulse 112  Temp(Src) 98.1 F (36.7 C) (Oral)  Resp 16  Ht 4\' 11"  (1.499 m)  Wt 197 lb (89.359 kg)  BMI 39.77 kg/m2  LMP 02/23/2014     FHT: BL 130 w/ moderate variability, +accels, no decels UC:   irregular, every 4-7 minutes SVE:   Dilation: 1 Effacement (%): 50 Station: -3 Exam by:: K.Syris Brookens, CNM Intracervical balloon passed just beyond internal os via speculum. Balloon filled w/ 60 ml fluid slowly, traction applied on catheter.  Of note: Quarter sized fluid filled non-painful cyst on R vaginal wall, non-tender, no erythema. No trauma to cyst while placing foley bulb with speculum.   Results for orders placed or performed during the hospital encounter of 12/07/14 (from the past 24 hour(s))  CBC     Status: Abnormal   Collection Time: 12/07/14  7:55 PM  Result Value Ref Range   WBC 10.6 (H) 4.0 - 10.5 K/uL   RBC 4.25 3.87 - 5.11 MIL/uL   Hemoglobin 11.8 (L) 12.0 - 15.0 g/dL   HCT 16.135.2 (L) 09.636.0 - 04.546.0 %   MCV 82.8 78.0 - 100.0 fL   MCH 27.8 26.0 - 34.0 pg   MCHC 33.5 30.0 - 36.0 g/dL   RDW 40.913.8 81.111.5 - 91.415.5 %   Platelets 227 150 - 400 K/uL  Comprehensive metabolic panel     Status: Abnormal   Collection Time: 12/07/14  7:55 PM  Result Value Ref Range   Sodium 134 (L) 135 - 145 mmol/L   Potassium 3.4 (L) 3.5 - 5.1 mmol/L   Chloride 107 96 - 112 mmol/L   CO2 18 (L) 19 - 32 mmol/L   Glucose, Bld 90 70 - 99 mg/dL   BUN 10 6 - 23 mg/dL   Creatinine, Ser 7.820.44 (L) 0.50 - 1.10 mg/dL   Calcium 8.9 8.4 - 95.610.5 mg/dL   Total Protein 7.3 6.0 - 8.3 g/dL   Albumin 3.3 (L) 3.5 - 5.2 g/dL   AST 16 0 - 37 U/L   ALT 14 0 - 35 U/L   Alkaline Phosphatase 190 (H) 39 - 117 U/L   Total Bilirubin 0.3 0.3 - 1.2 mg/dL   GFR calc non Af Amer >90 >90 mL/min   GFR calc Af Amer >90 >90 mL/min   Anion gap 9 5 - 15  Lactate dehydrogenase      Status: None   Collection Time: 12/07/14  7:55 PM  Result Value Ref Range   LDH 125 94 - 250 U/L  Uric acid     Status: None   Collection Time: 12/07/14  7:55 PM  Result Value Ref Range   Uric Acid, Serum 4.6 2.4 - 7.0 mg/dL  Type and screen     Status: None   Collection Time: 12/07/14  7:55 PM  Result Value Ref Range   ABO/RH(D) A POS    Antibody Screen NEG    Sample Expiration 12/10/2014    Assessment:  IOL due to h/o IUFD Cat 1 FHRT Normal preeclampsia labs. Will collect urine PCR when pt up to void  Plan: Strip reviewed by Dr. Charlotta Newtonzan Will begin low dose Pitocin Await extrusion of foley bulb Consult re: cyst  Monitor closely  Sherre ScarletWILLIAMS, Berdine Rasmusson CNM 12/07/2014, 10:16 PM

## 2014-12-08 ENCOUNTER — Encounter (HOSPITAL_COMMUNITY): Payer: Self-pay

## 2014-12-08 ENCOUNTER — Inpatient Hospital Stay (HOSPITAL_COMMUNITY): Payer: Medicaid Other | Admitting: Anesthesiology

## 2014-12-08 LAB — PROTEIN / CREATININE RATIO, URINE
CREATININE, URINE: 119 mg/dL
Protein Creatinine Ratio: 0.19 — ABNORMAL HIGH (ref 0.00–0.15)
TOTAL PROTEIN, URINE: 23 mg/dL

## 2014-12-08 LAB — RPR: RPR: NONREACTIVE

## 2014-12-08 MED ORDER — TERBUTALINE SULFATE 1 MG/ML IJ SOLN
0.2500 mg | Freq: Once | INTRAMUSCULAR | Status: DC | PRN
Start: 2014-12-08 — End: 2014-12-08
  Filled 2014-12-08: qty 1

## 2014-12-08 MED ORDER — ONDANSETRON HCL 4 MG/2ML IJ SOLN: 4.0000 mg | INTRAMUSCULAR | Status: DC | PRN

## 2014-12-08 MED ORDER — PRENATAL MULTIVITAMIN CH
1.0000 | ORAL_TABLET | Freq: Every day | ORAL | Status: DC
  Administered 2014-12-09 – 2014-12-10 (×2): 1 via ORAL
  Filled 2014-12-08 (×3): qty 1

## 2014-12-08 MED ORDER — DIBUCAINE 1 % RE OINT: 1.0000 "application " | TOPICAL_OINTMENT | RECTAL | Status: DC | PRN

## 2014-12-08 MED ORDER — BENZOCAINE-MENTHOL 20-0.5 % EX AERO
1.0000 "application " | INHALATION_SPRAY | CUTANEOUS | Status: DC | PRN
  Administered 2014-12-08: 1 via TOPICAL
  Filled 2014-12-08: qty 56

## 2014-12-08 MED ORDER — LANOLIN HYDROUS EX OINT: TOPICAL_OINTMENT | CUTANEOUS | Status: DC | PRN

## 2014-12-08 MED ORDER — ZOLPIDEM TARTRATE 5 MG PO TABS: 5.0000 mg | ORAL_TABLET | Freq: Every evening | ORAL | Status: DC | PRN

## 2014-12-08 MED ORDER — ACETAMINOPHEN 325 MG PO TABS
650.0000 mg | ORAL_TABLET | ORAL | Status: DC | PRN
Start: 2014-12-08 — End: 2014-12-10

## 2014-12-08 MED ORDER — OXYCODONE-ACETAMINOPHEN 5-325 MG PO TABS
2.0000 | ORAL_TABLET | ORAL | Status: DC | PRN
  Administered 2014-12-08 – 2014-12-10 (×6): 2 via ORAL
  Filled 2014-12-08 (×6): qty 2

## 2014-12-08 MED ORDER — OXYTOCIN 40 UNITS IN LACTATED RINGERS INFUSION - SIMPLE MED: 1.0000 m[IU]/min | INTRAVENOUS | Status: DC

## 2014-12-08 MED ORDER — OXYCODONE-ACETAMINOPHEN 5-325 MG PO TABS
1.0000 | ORAL_TABLET | ORAL | Status: DC | PRN
  Administered 2014-12-08: 1 via ORAL
  Filled 2014-12-08 (×3): qty 1

## 2014-12-08 MED ORDER — SENNOSIDES-DOCUSATE SODIUM 8.6-50 MG PO TABS
2.0000 | ORAL_TABLET | ORAL | Status: DC
  Administered 2014-12-08 – 2014-12-09 (×2): 2 via ORAL
  Filled 2014-12-08 (×2): qty 2

## 2014-12-08 MED ORDER — ONDANSETRON HCL 4 MG PO TABS: 4.0000 mg | ORAL_TABLET | ORAL | Status: DC | PRN

## 2014-12-08 MED ORDER — WITCH HAZEL-GLYCERIN EX PADS: 1.0000 "application " | MEDICATED_PAD | CUTANEOUS | Status: DC | PRN

## 2014-12-08 MED ORDER — SIMETHICONE 80 MG PO CHEW
80.0000 mg | CHEWABLE_TABLET | ORAL | Status: DC | PRN
Start: 2014-12-08 — End: 2014-12-10

## 2014-12-08 MED ORDER — DIPHENHYDRAMINE HCL 25 MG PO CAPS
25.0000 mg | ORAL_CAPSULE | Freq: Four times a day (QID) | ORAL | Status: DC | PRN
Start: 2014-12-08 — End: 2014-12-10

## 2014-12-08 MED ORDER — TETANUS-DIPHTH-ACELL PERTUSSIS 5-2.5-18.5 LF-MCG/0.5 IM SUSP: 0.5000 mL | Freq: Once | INTRAMUSCULAR | Status: DC

## 2014-12-08 MED ORDER — IBUPROFEN 600 MG PO TABS
600.0000 mg | ORAL_TABLET | Freq: Four times a day (QID) | ORAL | Status: DC
  Administered 2014-12-08 – 2014-12-10 (×9): 600 mg via ORAL
  Filled 2014-12-08 (×9): qty 1

## 2014-12-08 MED ORDER — LACTATED RINGERS IV SOLN
INTRAVENOUS | Status: DC
  Administered 2014-12-08 (×2): via INTRAUTERINE

## 2014-12-08 MED ORDER — LIDOCAINE HCL (PF) 1 % IJ SOLN
INTRAMUSCULAR | Status: DC | PRN
  Administered 2014-12-08 (×2): 5 mL
  Administered 2014-12-08: 3 mL

## 2014-12-08 NOTE — Progress Notes (Addendum)
  Subjective: C/O increased discomfort. Has had 2 doses of Nubain w/ minimal relief - asked for something stronger. Declines epidural at present. Denies sxs of preeclampsia. FOB at bedside.  Was asked by RN to review tracing.  Objective: BP 134/82 mmHg  Pulse 105  Temp(Src) 98.1 F (36.7 C) (Oral)  Resp 16  Ht 4\' 11"  (1.499 m)  Wt 197 lb (89.359 kg)  BMI 39.77 kg/m2  SpO2 98%  LMP 02/23/2014     Filed Vitals:   12/08/14 0400  BP: 134/82  Pulse: 105  Temp:   Resp:    One elevated BP of 144/87 at 03:30 AM  FHT: BL 130 w/ min variability, no accels, earlys, mild-mod variables to nadir 95-118 - slow return to baseline, no lates UC:   irregular, every 1-5 minutes SVE: Deferred   Pitocin at 6 mius/min Foley bulb taut  Assessment:  IUP at 39.2 wks IOL due to h/o term IUFD in 2014 Inadequate pain control Cat 2 FHRT Maternal tachycardia - 105 -114 bpm  Plan: Pit off. Continue other resuscitative measures prn. Explained that epidural would be next step to achieve desired comfort level. Continue IOL w/ just foley bulb for now. Consult prn. Expect progress and SVD.   Sherre ScarletWILLIAMS, Shelby Turbin CNM 12/08/2014, 4:37 AM

## 2014-12-08 NOTE — Anesthesia Preprocedure Evaluation (Signed)

## 2014-12-08 NOTE — Anesthesia Postprocedure Evaluation (Signed)
  Anesthesia Post-op Note  Patient: Shelby Olsen  Procedure(s) Performed: * No procedures listed *  Patient Location: PACU and Mother/Baby  Anesthesia Type:Epidural  Level of Consciousness: awake, alert  and oriented  Airway and Oxygen Therapy: Patient Spontanous Breathing  Post-op Pain: mild  Post-op Assessment: Post-op Vital signs reviewed, Patient's Cardiovascular Status Stable, Respiratory Function Stable, No signs of Nausea or vomiting, Adequate PO intake, Pain level controlled, No headache, No backache, No residual numbness and No residual motor weakness  Post-op Vital Signs: Reviewed and stable  Last Vitals:  Filed Vitals:   12/08/14 1233  BP: 127/77  Pulse: 104  Temp:   Resp: 20    Complications: No apparent anesthesia complications

## 2014-12-08 NOTE — Discharge Summary (Signed)
Vaginal Delivery Discharge Summary  Shelby Olsen  DOB:    [birth year] MRN:    [id number] CSN:    [id number]  Date of admission:                  12/07/14  Date of discharge:                   12/10/14  Procedures this admission:   SVB, repair of 1st degree right periurethral laceration  Date of Delivery: 12/08/14  Newborn Data:  Live born female  Birth Weight:  5+11 APGAR: 8, 9  Home with mother. Name: Ka'Rion Circumcision Plan: Outpatient  History of Present Illness:  Shelby Olsen is a 19 y.o. female, G3P2001, who presents at [redacted]w[redacted]d weeks gestation. The patient has been followed at [facility]. She was admitted induction of labor due to hx of term IUFD with last pregnancy. Her pregnancy has been complicated by:  Patient Active Problem List   Diagnosis Date Noted  . Vaginal delivery 12/08/2014  . History of migraine headaches - no meds 12/07/2014  . Irregular periods/menstrual cycles 12/07/2014  . Short stature 12/07/2014  . IUFD (intrauterine fetal death) - in previous pregnancy 12/06/2014  . BMI 39.0-39.9,adult 12/06/2014  . Late prenatal care 12/06/2014     Hospital Course:  Admitted 12/07/14 for induction due to hx of term IUFD in 2014 . Negative GBS. Progressed with foley bulb, short time of pitocin augementation. Utilized epidural for pain management.  Variable decels were noted during labor, with end stage bradycardia during a short pushing stage.  Delivery was performed by Nigel Bridgeman, CNM, without complication. Patient and baby tolerated the procedure without difficulty, with 1st degree right periurethral laceration noted. Patient did have a pre-existing right vaginal sidewall cyst that had been present prior to pregnancy--it remained intact during delivery.  Infant status was stable and remained in room with mother.  Mother and infant then had an uncomplicated postpartum course, with  breast feeding going slowly, and patient planning to breast and bottle feed after d/c.. Mom's physical exam was WNL, and she was discharged home in stable condition. Contraception plan was Nexplanon, with eventual desire for BTL, but patient currently too young for procedure.  She received adequate benefit from po pain medications, using Motrin and Percocet.   Feeding:  breast and bottle  Contraception:  Nexplanon  Hemoglobin Results:  CBC Latest Ref Rng 12/09/2014 12/07/2014 11/28/2014  WBC 4.0 - 10.5 K/uL 11.7(H) 10.6(H) 12.9(H)  Hemoglobin 12.0 - 15.0 g/dL 10.2(L) 11.8(L) 12.1  Hematocrit 36.0 - 46.0 % 31.0(L) 35.2(L) 37.1  Platelets 150 - 400 K/uL 175 227 218     Discharge Physical Exam:   General: alert Lochia: appropriate Uterine Fundus: firm Incision: healing well DVT Evaluation: No evidence of DVT seen on physical exam. Negative Homan's sign.  Intrapartum Procedures: spontaneous vaginal delivery Postpartum Procedures: none Complications-Operative and Postpartum: 1st degree periurethral laceration  Discharge Diagnoses: Term Pregnancy-delivered  Discharge Information:  Activity:           pelvic rest Diet:                routine Medications: Ibuprofen and Percocet Condition:      stable Instructions:     Discharge to: home  Follow-up Information    Follow up with [facility]. Schedule an appointment as soon as possible for a visit in 5 weeks.   Specialty:  Obstetrics and Gynecology   Why:  Call for any questions or concerns.   Contact information:   [address] [place] [id number] [phone]       Nigel BridgemanLATHAM, Cindy Brindisi CNM 12/10/2014 9:00 AM

## 2014-12-08 NOTE — Progress Notes (Addendum)
  Subjective: Foley bulb out. Reports pain 8-9/10. Ready for epidural.  Objective: BP 134/82 mmHg  Pulse 105  Temp(Src) 98.1 F (36.7 C) (Oral)  Resp 16  Ht 4\' 11"  (1.499 m)  Wt 197 lb (89.359 kg)  BMI 39.77 kg/m2  SpO2 98%  LMP 02/23/2014     FHT: BL 130 w/ min-moderate variability, +accels, mild-mod variables to nadir 70-90, no lates, + earlys UC:   irregular, every 1-4 minutes, palpate mod SVE:   Dilation: 4 Effacement (%): 100 Station: -2 Exam by:: Shelby Olsen, CNM  Foley bulb out at 05:25 AM  Assessment:  Cat 2 FHRT BBOW Unbearable pain  Plan: Prepare for epidural Continue intrauterine resuscitative measures prn AROM and IUPC placement when comfortable w/ epidural. Reviewed in detail w/ pt and significant other; both in agreement Obtain urine PCR after foley placed Anticipate progress and SVD  Shelby ScarletWILLIAMS, Shelby [place] CNM 12/08/2014, 5:32 AM

## 2014-12-08 NOTE — Anesthesia Procedure Notes (Signed)
Epidural Patient location during procedure: OB  Staffing Anesthesiologist: Phillips GroutARIGNAN, Shelby Olsen Performed by: anesthesiologist   Preanesthetic Checklist Completed: patient identified, site marked, surgical consent, pre-op evaluation, timeout performed, IV checked, risks and benefits discussed and monitors and equipment checked  Epidural Patient position: sitting Prep: DuraPrep Patient monitoring: heart rate, continuous pulse ox and blood pressure Approach: right paramedian Location: L4-L5 Injection technique: LOR saline  Needle:  Needle type: Tuohy  Needle gauge: 17 G Needle length: 9 cm and 9 Needle insertion depth: 5 cm Catheter type: closed end flexible Catheter size: 20 Guage Catheter at skin depth: 10 cm Test dose: negative  Assessment Events: blood not aspirated, injection not painful, no injection resistance, negative IV test and no paresthesia  Additional Notes   Patient tolerated the insertion well without complications.

## 2014-12-08 NOTE — Lactation Note (Signed)
This note was copied from the chart of Shelby Olsen. Lactation Consultation Note Initial visit at 9 hours of age. Mom reports several good feedings and she denies pain.  MOm does not see colostrum with hand expression, but reports baby is drinking at the breast.  Latch score of 8 and 3 feedings recorded.  Assisted with hand expression and no colostrum visible at this time.  Encouraged mom to continue trying.  [facility] resources given and discussed.  Encouraged to feed with early cues on demand.  Early newborn behavior discussed.  Mom to call for assist as needed.    Patient Name: Shelby Vikki Portslizabeth Sisk ZDGLO'VToday's Date: 12/08/2014 Reason for consult: Initial assessment   Maternal Data    Feeding Feeding Type: Breast Fed Length of feed: 30 min  LATCH Score/Interventions                Intervention(s): Breastfeeding basics reviewed     Lactation Tools Discussed/Used WIC Program: Yes   Consult Status Consult Status: Follow-up Date: 12/09/14 Follow-up type: In-patient    Jannifer RodneyShoptaw, Jana Lynn 12/08/2014, 6:00 PM

## 2014-12-08 NOTE — Progress Notes (Signed)
  Subjective: More comfortable now s/p epidural.  Objective: BP 117/53 mmHg  Pulse 89  Temp(Src) 98 F (36.7 C) (Oral)  Resp 18  Ht 4\' 11"  (1.499 m)  Wt 197 lb (89.359 kg)  BMI 39.77 kg/m2  SpO2 100%  LMP 02/23/2014      FHT: Category 2--moderate variability, variable decels with UCs UC:   irregular, every 2-4 minutes SVE:   Dilation: 10 Effacement (%): 70, 80 Station: -2 Exam by:: Nigel BridgemanVicki Ananda Sitzer, CNM  No significant descent with UC, minimal power of UCs IUPC in place, amnioinfusion infusing baseline dose  More contractions s/p VE.  Assessment:  2nd stage labor Category 2 FHR  Plan: Observe FHR and UC status at present, will start pitocin prn.  Nigel BridgemanLATHAM, Tillie Viverette CNM 12/08/2014, 8:45 AM

## 2014-12-08 NOTE — Progress Notes (Addendum)
  Subjective: Comfortable w/ epidural, rates pain 0/10. FOB and couple's son at bedside.  Objective: BP 114/74 mmHg  Pulse 77  Temp(Src) 98.1 F (36.7 C) (Oral)  Resp 16  Ht 4\' 11"  (1.499 m)  Wt 197 lb (89.359 kg)  BMI 39.77 kg/m2  SpO2 100%  LMP 02/23/2014     Today's Vitals   12/08/14 0623 12/08/14 0625 12/08/14 0628 12/08/14 0630  BP:  120/82  114/74  Pulse: 75 82 83 77  Temp:      TempSrc:      Resp:    16  Height:      Weight:      SpO2: 100%  100%   PainSc:       FHT: Category 2 - mild-mod variables, min-mod variability UC:   irregular, every 3-4 minutes SVE:   Dilation: 4 Effacement (%): 70, 80 Station: -2 Exam by:: Sherre ScarletKimberly Jaylise Peek, CNM AROM'd, small amount of blood tinged fluid @ 06:33 AM IUPC placed @ 06:45 AM  Assessment:  IOL due to previous term IUFD Cat 2 FHRT  Plan: Begin amnioinfusion - discussed w/ couple who concurs Urine PCR obtained Defer Pitocin restart to oncoming CNM Continue intrauterine resuscitative measures Consult prn Expect progress and SVD  Sherre ScarletWILLIAMS, Circe Chilton CNM 12/08/2014, 6:47 AM

## 2014-12-09 LAB — CBC
HCT: 31 % — ABNORMAL LOW (ref 36.0–46.0)
Hemoglobin: 10.2 g/dL — ABNORMAL LOW (ref 12.0–15.0)
MCH: 27.6 pg (ref 26.0–34.0)
MCHC: 32.9 g/dL (ref 30.0–36.0)
MCV: 83.8 fL (ref 78.0–100.0)
Platelets: 175 10*3/uL (ref 150–400)
RBC: 3.7 MIL/uL — AB (ref 3.87–5.11)
RDW: 14 % (ref 11.5–15.5)
WBC: 11.7 10*3/uL — ABNORMAL HIGH (ref 4.0–10.5)

## 2014-12-09 NOTE — Lactation Note (Signed)
This note was copied from the chart of Shelby Olsen. Lactation Consultation Note  P2, Ex BF for 2 years.  Left nipple tender and baby has difficulty latching on breast. Provided mother w/ hand pump and suggest she prepump to help evert nipple and increase flow. Attempted latching on left breast but baby only sucked a few times and came off. Baby's tummy full from recent 40 min feeding. Latched briefly on right breast and also suggested to mother to start on right and switch within 10 min. Baby has a wide gape when latching. Discussed applying ebm for soreness and provided her w/ comfort gels and a hand pump. Encouraged her to call if she needs further assistance.  Patient Name: Shelby Vikki Portslizabeth Kloosterman UJWJX'BToday's Date: 12/09/2014 Reason for consult: Follow-up assessment   Maternal Data    Feeding Feeding Type: Breast Fed Length of feed: 40 min  LATCH Score/Interventions                      Lactation Tools Discussed/Used     Consult Status Consult Status: Follow-up Date: 12/10/14 Follow-up type: In-patient    Dahlia ByesBerkelhammer, Georgenia Salim [facility] 12/09/2014, 4:42 PM

## 2014-12-09 NOTE — Progress Notes (Signed)
  Subjective: Postpartum Day 1: Vaginal delivery, 1st degree right periurethral laceration Patient up ad lib, reports no syncope or dizziness. Feeding:  Breast and bottle Contraceptive plan:  Nexplanon  Baby weight 5+11.  Objective: Vital signs in last 24 hours: Temp:  [98.2 F (36.8 C)-98.5 F (36.9 C)] 98.2 F (36.8 C) (04/15 0603) Pulse Rate:  [89-111] 96 (04/15 0603) Resp:  [16-20] 18 (04/15 0603) BP: (107-140)/(53-82) 107/60 mmHg (04/15 0603) SpO2:  [99 %] 99 % (04/14 2305)  Physical Exam:  General: alert Lochia: appropriate Uterine Fundus: firm Perineum: healing well DVT Evaluation: No evidence of DVT seen on physical exam. Negative Homan's sign.  CBC Latest Ref Rng 12/09/2014 12/07/2014 11/28/2014  WBC 4.0 - 10.5 K/uL 11.7(H) 10.6(H) 12.9(H)  Hemoglobin 12.0 - 15.0 g/dL 10.2(L) 11.8(L) 12.1  Hematocrit 36.0 - 46.0 % 31.0(L) 35.2(L) 37.1  Platelets 150 - 400 K/uL 175 227 218     Assessment/Plan: Status post vaginal delivery day 1. Stable Continue current care. Plan for discharge tomorrow    Nyra CapesLATHAM, VICKICNM 12/09/2014, 7:03 AM

## 2014-12-10 MED ORDER — IBUPROFEN 600 MG PO TABS: 600.0000 mg | ORAL_TABLET | Freq: Four times a day (QID) | ORAL | Status: DC | PRN

## 2014-12-10 MED ORDER — OXYCODONE-ACETAMINOPHEN 5-325 MG PO TABS: 1.0000 | ORAL_TABLET | ORAL | Status: DC | PRN

## 2014-12-10 NOTE — Discharge Instructions (Signed)
Postpartum Care After Vaginal Delivery  °After you deliver your newborn (postpartum period), the usual stay in the hospital is 24-72 hours. If there were problems with your labor or delivery, or if you have other medical problems, you might be in the hospital longer.  °While you are in the hospital, you will receive help and instructions on how to care for yourself and your newborn during the postpartum period.  °While you are in the hospital:  °Be sure to tell your nurses if you have pain or discomfort, as well as where you feel the pain and what makes the pain worse.  °If you had an incision made near your vagina (episiotomy) or if you had some tearing during delivery, the nurses may put ice packs on your episiotomy or tear. The ice packs may help to reduce the pain and swelling.  °If you are breastfeeding, you may feel uncomfortable contractions of your uterus for a couple of weeks. This is normal. The contractions help your uterus get back to normal size.  °It is normal to have some bleeding after delivery.  °For the first 1-3 days after delivery, the flow is red and the amount may be similar to a period.  °It is common for the flow to start and stop.  °In the first few days, you may pass some small clots. Let your nurses know if you begin to pass large clots or your flow increases.  °Do not flush blood clots down the toilet before having the nurse look at them.  °During the next 3-10 days after delivery, your flow should become more watery and pink or brown-tinged in color.  °Ten to fourteen days after delivery, your flow should be a small amount of yellowish-white discharge.  °The amount of your flow will decrease over the first few weeks after delivery. Your flow may stop in 6-8 weeks. Most women have had their flow stop by 12 weeks after delivery. °You should change your sanitary pads frequently.  °Wash your hands thoroughly with soap and water for at least 20 seconds after changing pads, using the toilet,  or before holding or feeding your newborn.  °You should feel like you need to empty your bladder within the first 6-8 hours after delivery.  °In case you become weak, lightheaded, or faint, call your nurse before you get out of bed for the first time and before you take a shower for the first time.  °Within the first few days after delivery, your breasts may begin to feel tender and full. This is called engorgement. Breast tenderness usually goes away within 48-72 hours after engorgement occurs. You may also notice milk leaking from your breasts. If you are not breastfeeding, do not stimulate your breasts. Breast stimulation can make your breasts produce more milk.  °Spending as much time as possible with your newborn is very important. During this time, you and your newborn can feel close and get to know each other. Having your newborn stay in your room (rooming in) will help to strengthen the bond with your newborn. It will give you time to get to know your newborn and become comfortable caring for your newborn.  °Your hormones change after delivery. Sometimes the hormone changes can temporarily cause you to feel sad or tearful. These feelings should not last more than a few days. If these feelings last longer than that, you should talk to your caregiver.  °If desired, talk to your caregiver about methods of family planning or contraception.  °  Talk to your caregiver about immunizations. Your caregiver may want you to have the following immunizations before leaving the hospital:  °Tetanus, diphtheria, and pertussis (Tdap) or tetanus and diphtheria (Td) immunization. It is very important that you and your family (including grandparents) or others caring for your newborn are up-to-date with the Tdap or Td immunizations. The Tdap or Td immunization can help protect your newborn from getting ill.  °Rubella immunization.  °Varicella (chickenpox) immunization.  °Influenza immunization. You should receive this annual  immunization if you did not receive the immunization during your pregnancy. °Document Released: 06/09/2007 Document Revised: 05/06/2012 Document Reviewed: 04/08/2012  °Exit[employer]® Patient Information ©2015 [employer]. This information is not intended to replace advice given to you by your health care provider. Make sure you discuss any questions you have with your health care provider.  ° °Breastfeeding °Deciding to breastfeed is one of the best choices you can make for you and your baby. A change in hormones during pregnancy causes your breast tissue to grow and increases the number and size of your milk ducts. These hormones also allow proteins, sugars, and fats from your blood supply to make breast milk in your milk-producing glands. Hormones prevent breast milk from being released before your baby is born as well as prompt milk flow after birth. Once breastfeeding has begun, thoughts of your baby, as well as his or her sucking or crying, can stimulate the release of milk from your milk-producing glands.  °BENEFITS OF BREASTFEEDING °For Your Baby °· Your first milk (colostrum) helps your baby's digestive system function better.   °· There are antibodies in your milk that help your baby fight off infections.   °· Your baby has a lower incidence of asthma, allergies, and sudden infant death syndrome.   °· The nutrients in breast milk are better for your baby than infant formulas and are designed uniquely for your baby's needs.   °· Breast milk improves your baby's brain development.   °· Your baby is less likely to develop other conditions, such as childhood obesity, asthma, or type 2 diabetes mellitus.   °For You  °· Breastfeeding helps to create a very special bond between you and your baby.   °· Breastfeeding is convenient. Breast milk is always available at the correct temperature and costs nothing.   °· Breastfeeding helps to burn calories and helps you lose the weight gained during pregnancy.    °· Breastfeeding makes your uterus contract to its prepregnancy size faster and slows bleeding (lochia) after you give birth.   °· Breastfeeding helps to lower your risk of developing type 2 diabetes mellitus, osteoporosis, and breast or ovarian cancer later in life. °SIGNS THAT YOUR BABY IS HUNGRY °Early Signs of Hunger  °· Increased alertness or activity. °· Stretching. °· Movement of the head from side to side. °· Movement of the head and opening of the mouth when the corner of the mouth or cheek is stroked (rooting). °· Increased sucking sounds, smacking lips, cooing, sighing, or squeaking. °· Hand-to-mouth movements. °· Increased sucking of fingers or hands. °Late Signs of Hunger °· Fussing. °· Intermittent crying. °Extreme Signs of Hunger °Signs of extreme hunger will require calming and consoling before your baby will be able to breastfeed successfully. Do not wait for the following signs of extreme hunger to occur before you initiate breastfeeding:   °· Restlessness. °· A loud, strong cry. °·  Screaming. °BREASTFEEDING BASICS °Breastfeeding Initiation °· Find a comfortable place to sit or lie down, with your neck and back well supported. °· Place a pillow or   rolled up blanket under your baby to bring him or her to the level of your breast (if you are seated). Nursing pillows are specially designed to help support your arms and your baby while you breastfeed. °· Make sure that your baby's abdomen is facing your abdomen.   °· Gently massage your breast. With your fingertips, massage from your chest wall toward your nipple in a circular motion. This encourages milk flow. You may need to continue this action during the feeding if your milk flows slowly. °· Support your breast with 4 fingers underneath and your thumb above your nipple. Make sure your fingers are well away from your nipple and your baby's mouth.   °· Stroke your baby's lips gently with your finger or nipple.   °· When your baby's mouth is open  wide enough, quickly bring your baby to your breast, placing your entire nipple and as much of the colored area around your nipple (areola) as possible into your baby's mouth.   °¨ More areola should be visible above your baby's upper lip than below the lower lip.   °¨ Your baby's tongue should be between his or her lower gum and your breast.   °· Ensure that your baby's mouth is correctly positioned around your nipple (latched). Your baby's lips should create a seal on your breast and be turned out (everted). °· It is common for your baby to suck about 2-3 minutes in order to start the flow of breast milk. °Latching °Teaching your baby how to latch on to your breast properly is very important. An improper latch can cause nipple pain and decreased milk supply for you and poor weight gain in your baby. Also, if your baby is not latched onto your nipple properly, he or she may swallow some air during feeding. This can make your baby fussy. Burping your baby when you switch breasts during the feeding can help to get rid of the air. However, teaching your baby to latch on properly is still the best way to prevent fussiness from swallowing air while breastfeeding. °Signs that your baby has successfully latched on to your nipple:    °· Silent tugging or silent sucking, without causing you pain.   °· Swallowing heard between every 3-4 sucks.   °·  Muscle movement above and in front of his or her ears while sucking.   °Signs that your baby has not successfully latched on to nipple:  °· Sucking sounds or smacking sounds from your baby while breastfeeding. °· Nipple pain. °If you think your baby has not latched on correctly, slip your finger into the corner of your baby's mouth to break the suction and place it between your baby's gums. Attempt breastfeeding initiation again. °Signs of Successful Breastfeeding °Signs from your baby:   °· A gradual decrease in the number of sucks or complete cessation of sucking.   °· Falling  asleep.   °· Relaxation of his or her body.   °· Retention of a small amount of milk in his or her mouth.   °· Letting go of your breast by himself or herself. °Signs from you: °· Breasts that have increased in firmness, weight, and size 1-3 hours after feeding.   °· Breasts that are softer immediately after breastfeeding. °· Increased milk volume, as well as a change in milk consistency and color by the fifth day of breastfeeding.   °· Nipples that are not sore, cracked, or bleeding. °Signs That Your Baby is Getting Enough Milk °· Wetting at least 3 diapers in a 24-hour period. The urine should be clear and   pale yellow by age 5 days. °· At least 3 stools in a 24-hour period by age 5 days. The stool should be soft and yellow. °· At least 3 stools in a 24-hour period by age 7 days. The stool should be seedy and yellow. °· No loss of weight greater than 10% of birth weight during the first 3 days of age. °· Average weight gain of 4-7 ounces (113-198 g) per week after age 4 days. °· Consistent daily weight gain by age 5 days, without weight loss after the age of 2 weeks. °After a feeding, your baby may spit up a small amount. This is common. °BREASTFEEDING FREQUENCY AND DURATION °Frequent feeding will help you make more milk and can prevent sore nipples and breast engorgement. Breastfeed when you feel the need to reduce the fullness of your breasts or when your baby shows signs of hunger. This is called "breastfeeding on demand." Avoid introducing a pacifier to your baby while you are working to establish breastfeeding (the first 4-6 weeks after your baby is born). After this time you may choose to use a pacifier. Research has shown that pacifier use during the first year of a baby's life decreases the risk of sudden infant death syndrome (SIDS). °Allow your baby to feed on each breast as long as he or she wants. Breastfeed until your baby is finished feeding. When your baby unlatches or falls asleep while feeding from  the first breast, offer the second breast. Because newborns are often sleepy in the first few weeks of life, you may need to awaken your baby to get him or her to feed. °Breastfeeding times will vary from baby to baby. However, the following rules can serve as a guide to help you ensure that your baby is properly fed: °· Newborns (babies 4 weeks of age or younger) may breastfeed every 1-3 hours. °· Newborns should not go longer than 3 hours during the day or 5 hours during the night without breastfeeding. °· You should breastfeed your baby a minimum of 8 times in a 24-hour period until you begin to introduce solid foods to your baby at around 6 months of age. °BREAST MILK PUMPING °Pumping and storing breast milk allows you to ensure that your baby is exclusively fed your breast milk, even at times when you are unable to breastfeed. This is especially important if you are going back to work while you are still breastfeeding or when you are not able to be present during feedings. Your lactation consultant can give you guidelines on how long it is safe to store breast milk.  °A breast pump is a machine that allows you to pump milk from your breast into a sterile bottle. The pumped breast milk can then be stored in a refrigerator or freezer. Some breast pumps are operated by hand, while others use electricity. Ask your lactation consultant which type will work best for you. Breast pumps can be purchased, but some hospitals and breastfeeding support groups lease breast pumps on a monthly basis. A lactation consultant can teach you how to hand express breast milk, if you prefer not to use a pump.  °CARING FOR YOUR BREASTS WHILE YOU BREASTFEED °Nipples can become dry, cracked, and sore while breastfeeding. The following recommendations can help keep your breasts moisturized and healthy: °· Avoid using soap on your nipples.   °· Wear a supportive bra. Although not required, special nursing bras and tank tops are designed to  allow access to your breasts for   breastfeeding without taking off your entire bra or top. Avoid wearing underwire-style bras or extremely tight bras. °· Air dry your nipples for 3-4 minutes after each feeding.   °· Use only cotton bra pads to absorb leaked breast milk. Leaking of breast milk between feedings is normal.   °· Use lanolin on your nipples after breastfeeding. Lanolin helps to maintain your skin's normal moisture barrier. If you use pure lanolin, you do not need to wash it off before feeding your baby again. Pure lanolin is not toxic to your baby. You may also hand express a few drops of breast milk and gently massage that milk into your nipples and allow the milk to air dry. °In the first few weeks after giving birth, some women experience extremely full breasts (engorgement). Engorgement can make your breasts feel heavy, warm, and tender to the touch. Engorgement peaks within 3-5 days after you give birth. The following recommendations can help ease engorgement: °· Completely empty your breasts while breastfeeding or pumping. You may want to start by applying warm, moist heat (in the shower or with warm water-soaked hand towels) just before feeding or pumping. This increases circulation and helps the milk flow. If your baby does not completely empty your breasts while breastfeeding, pump any extra milk after he or she is finished. °· Wear a snug bra (nursing or regular) or tank top for 1-2 days to signal your body to slightly decrease milk production. °· Apply ice packs to your breasts, unless this is too uncomfortable for you. °· Make sure that your baby is latched on and positioned properly while breastfeeding. °If engorgement persists after 48 hours of following these recommendations, contact your health care provider or a lactation consultant. °OVERALL HEALTH CARE RECOMMENDATIONS WHILE BREASTFEEDING °· Eat healthy foods. Alternate between meals and snacks, eating 3 of each per day. Because what you  eat affects your breast milk, some of the foods may make your baby more irritable than usual. Avoid eating these foods if you are sure that they are negatively affecting your baby. °· Drink milk, fruit juice, and water to satisfy your thirst (about 10 glasses a day).   °· Rest often, relax, and continue to take your prenatal vitamins to prevent fatigue, stress, and anemia. °· Continue breast self-awareness checks. °· Avoid chewing and smoking tobacco. °· Avoid alcohol and drug use. °Some medicines that may be harmful to your baby can pass through breast milk. It is important to ask your health care provider before taking any medicine, including all over-the-counter and prescription medicine as well as vitamin and herbal supplements. °It is possible to become pregnant while breastfeeding. If birth control is desired, ask your health care provider about options that will be safe for your baby. °SEEK MEDICAL CARE IF:  °· You feel like you want to stop breastfeeding or have become frustrated with breastfeeding. °· You have painful breasts or nipples. °· Your nipples are cracked or bleeding. °· Your breasts are red, tender, or warm. °· You have a swollen area on either breast. °· You have a fever or chills. °· You have nausea or vomiting. °· You have drainage other than breast milk from your nipples. °· Your breasts do not become full before feedings by the fifth day after you give birth. °· You feel sad and depressed. °· Your baby is too sleepy to eat well. °· Your baby is having trouble sleeping.   °· Your baby is wetting less than 3 diapers in a 24-hour period. °· Your baby has   less than 3 stools in a 24-hour period. °· Your baby's skin or the white part of his or her eyes becomes yellow.   °· Your baby is not gaining weight by 5 days of age. °SEEK IMMEDIATE MEDICAL CARE IF:  °· Your baby is overly tired (lethargic) and does not want to wake up and feed. °· Your baby develops an unexplained fever. °Document Released:  08/12/2005 Document Revised: 08/17/2013 Document Reviewed: 02/03/2013 °[employer]® Patient Information ©2015 [employer]. This information is not intended to replace advice given to you by your health care provider. Make sure you discuss any questions you have with your health care provider. ° °

## 2014-12-10 NOTE — Lactation Note (Signed)
This note was copied from the chart of Shelby Vikki Portslizabeth Blouch. Lactation Consultation Note  Patient Name: Shelby Olsen ZOXWR'UToday's Date: 12/10/2014 Reason for consult: Follow-up assessmentwith this mom of a term baby, SGA, weighing 5 lbs 2 oz today, at 7% weight loss at 48 hours of life. I assisted mom with latching the baby in football hold. He would open wide, but latach shallow, and then unlatch. I was able to express some colostrum, but mom very tender. Her left areola/nipple very edematous, her right WNL. Mom latched to right breast. I applied a 20 nipple shield, and the baby latched well, fairly deep. I showed mom how to apply, and told her to look for milk in the shield after feeding. I gave mom a 20 shield for her right nipple and a 24 for her left. I will follow up with this mom and bab today, prior to their discharge. Mom is an experienced breast feeder.    Maternal Data    Feeding Feeding Type: Breast Fed  LATCH Score/Interventions Latch: Repeated attempts needed to sustain latch, nipple held in mouth throughout feeding, stimulation needed to elicit sucking reflex. (baby not able to maintain a latch until nipple shiled applied. 20 opn right breast, 24 on left - left areola edematous. mom to put bra on, reverse pressure prio to latch taught) Intervention(s): Adjust position;Assist with latch;Breast compression  Audible Swallowing: None Intervention(s): Skin to skin;Hand expression  Type of Nipple: Everted at rest and after stimulation  Comfort (Breast/Nipple): Filling, red/small blisters or bruises, mild/mod discomfort  Problem noted: Filling;Mild/Moderate discomfort Interventions (Filling): Reverse pressure Interventions (Mild/moderate discomfort): Comfort gels  Hold (Positioning): Assistance needed to correctly position infant at breast and maintain latch. Intervention(s): Breastfeeding basics reviewed;Support Pillows;Position options;Skin to skin  LATCH Score:  5  Lactation Tools Discussed/Used Tools: Nipple Shields Nipple shield size: 20;24   Consult Status Consult Status: Follow-up Date: 12/10/14 Follow-up type: In-patient    Alfred LevinsLee, Luis Sami Anne 12/10/2014, 9:12 AM

## 2014-12-12 NOTE — Progress Notes (Signed)
Ur chart review completed.  

## 2015-01-16 ENCOUNTER — Encounter (HOSPITAL_COMMUNITY): Payer: Self-pay | Admitting: Emergency Medicine

## 2015-01-16 ENCOUNTER — Emergency Department (HOSPITAL_COMMUNITY)
Admission: EM | Admit: 2015-01-16 | Discharge: 2015-01-16 | Disposition: A | Discharge status: Home / Self Care | Payer: Medicaid Other | Source: Home / Self Care | Attending: Family Medicine | Admitting: Family Medicine

## 2015-01-16 ENCOUNTER — Other Ambulatory Visit (HOSPITAL_COMMUNITY)
Admission: RE | Admit: 2015-01-16 | Discharge: 2015-01-16 | Disposition: A | Discharge status: Home / Self Care | Payer: Medicaid Other | Source: Ambulatory Visit | Attending: Family Medicine | Admitting: Family Medicine

## 2015-01-16 DIAGNOSIS — N760 Acute vaginitis: Secondary | ICD-10-CM | POA: Insufficient documentation

## 2015-01-16 DIAGNOSIS — Z113 Encounter for screening for infections with a predominantly sexual mode of transmission: Secondary | ICD-10-CM | POA: Diagnosis present

## 2015-01-16 LAB — POCT URINALYSIS DIP (DEVICE)
Bilirubin Urine: NEGATIVE
Glucose, UA: NEGATIVE mg/dL
HGB URINE DIPSTICK: NEGATIVE
Ketones, ur: NEGATIVE mg/dL
Nitrite: NEGATIVE
Protein, ur: NEGATIVE mg/dL
SPECIFIC GRAVITY, URINE: 1.02 (ref 1.005–1.030)
UROBILINOGEN UA: 0.2 mg/dL (ref 0.0–1.0)
pH: 6.5 (ref 5.0–8.0)

## 2015-01-16 LAB — POCT PREGNANCY, URINE: PREG TEST UR: NEGATIVE

## 2015-01-16 MED ORDER — FLUCONAZOLE 150 MG PO TABS: 150.0000 mg | ORAL_TABLET | Freq: Once | ORAL | Status: DC

## 2015-01-16 NOTE — ED Notes (Signed)
Call back number verified.  

## 2015-01-16 NOTE — ED Provider Notes (Signed)
Shelby Olsen is a 19 y.o. female who presents to Urgent Care today for low irritation and discharge present for the last 3 days. No fevers chills nausea vomiting or diarrhea. No treatment tried yet. Patient is approximately 4 weeks postpartum vaginal delivery. Patient denies any urinary symptoms.   Past Medical History  Diagnosis Date  . Migraine   . Hx of measles    Past Surgical History  Procedure Laterality Date  . No past surgeries     History  Substance Use Topics  . Smoking status: Never Smoker   . Smokeless tobacco: Never Used  . Alcohol Use: No   ROS as above Medications: No current facility-administered medications for this encounter.   Current Outpatient Prescriptions  Medication Sig Dispense Refill  . acetaminophen (TYLENOL) 325 MG tablet Take 650 mg by mouth every 6 (six) hours as needed for headache.    . fluconazole (DIFLUCAN) 150 MG tablet Take 1 tablet (150 mg total) by mouth once. 1 tablet 1  . ibuprofen (ADVIL,MOTRIN) 600 MG tablet Take 1 tablet (600 mg total) by mouth every 6 (six) hours as needed. 30 tablet 2  . Prenatal Vit-Fe Fumarate-FA (PRENATAL MULTIVITAMIN) TABS tablet Take 1 tablet by mouth every morning.     Allergies  Allergen Reactions  . Peanuts [Peanut Oil] Anaphylaxis and Hives     Exam:  BP 142/99 mmHg  Pulse 64  Temp(Src) 98.5 F (36.9 C) (Oral)  Resp 16  SpO2 100%  Breastfeeding? Yes Gen: Well NAD HEENT: EOMI,  MMM Lungs: Normal work of breathing. CTABL Heart: RRR no MRG Abd: NABS, Soft. Nondistended, Nontender Exts: Brisk capillary refill, warm and well perfused.  Genitals, external genitalia mildly erythematous with discharge. No lesions present. Vaginal canal with thick white discharge normal-appearing cervix. Nontender.  Results for orders placed or performed during the hospital encounter of 01/16/15 (from the past 24 hour(s))  POCT urinalysis dip (device)     Status: Abnormal   Collection Time: 01/16/15  1:42 PM   Result Value Ref Range   Glucose, UA NEGATIVE NEGATIVE mg/dL   Bilirubin Urine NEGATIVE NEGATIVE   Ketones, ur NEGATIVE NEGATIVE mg/dL   Specific Gravity, Urine 1.020 1.005 - 1.030   Hgb urine dipstick NEGATIVE NEGATIVE   pH 6.5 5.0 - 8.0   Protein, ur NEGATIVE NEGATIVE mg/dL   Urobilinogen, UA 0.2 0.0 - 1.0 mg/dL   Nitrite NEGATIVE NEGATIVE   Leukocytes, UA LARGE (A) NEGATIVE  Pregnancy, urine POC     Status: None   Collection Time: 01/16/15  1:42 PM  Result Value Ref Range   Preg Test, Ur NEGATIVE NEGATIVE   No results found.  Assessment and Plan: 19 y.o. female with vaginitis likely yeast. Cytology pending for gonorrhea Chlamydia trichomonas BV and yeast. Treat with fluconazole. Urine culture pending.  Discussed warning signs or symptoms. Please see discharge instructions. Patient expresses understanding.     Rodolph BongEvan S Shaterria Sager, MD 01/16/15 [phone]

## 2015-01-16 NOTE — ED Notes (Signed)
Pt reports swelling of vagina onset 2 days associated small lesions Denies abd pain and urinary sx Recent pregnancy Alert, no signs of acute distress.

## 2015-01-16 NOTE — Discharge Instructions (Signed)
Thank you for coming in today. Follow up with OBGYN Monilial Vaginitis Vaginitis in a soreness, swelling and redness (inflammation) of the vagina and vulva. Monilial vaginitis is not a sexually transmitted infection. CAUSES  Yeast vaginitis is caused by yeast (candida) that is normally found in your vagina. With a yeast infection, the candida has overgrown in number to a point that upsets the chemical balance. SYMPTOMS   White, thick vaginal discharge.  Swelling, itching, redness and irritation of the vagina and possibly the lips of the vagina (vulva).  Burning or painful urination.  Painful intercourse. DIAGNOSIS  Things that may contribute to monilial vaginitis are:  Postmenopausal and virginal states.  Pregnancy.  Infections.  Being tired, sick or stressed, especially if you had monilial vaginitis in the past.  Diabetes. Good control will help lower the chance.  Birth control pills.  Tight fitting garments.  Using bubble bath, feminine sprays, douches or deodorant tampons.  Taking certain medications that kill germs (antibiotics).  Sporadic recurrence can occur if you become ill. TREATMENT  Your caregiver will give you medication.  There are several kinds of anti monilial vaginal creams and suppositories specific for monilial vaginitis. For recurrent yeast infections, use a suppository or cream in the vagina 2 times a week, or as directed.  Anti-monilial or steroid cream for the itching or irritation of the vulva may also be used. Get your caregiver's permission.  Painting the vagina with methylene blue solution may help if the monilial cream does not work.  Eating yogurt may help prevent monilial vaginitis. HOME CARE INSTRUCTIONS   Finish all medication as prescribed.  Do not have sex until treatment is completed or after your caregiver tells you it is okay.  Take warm sitz baths.  Do not douche.  Do not use tampons, especially scented ones.  Wear cotton  underwear.  Avoid tight pants and panty hose.  Tell your sexual partner that you have a yeast infection. They should go to their caregiver if they have symptoms such as mild rash or itching.  Your sexual partner should be treated as well if your infection is difficult to eliminate.  Practice safer sex. Use condoms.  Some vaginal medications cause latex condoms to fail. Vaginal medications that harm condoms are:  Cleocin cream.  Butoconazole (Femstat).  Terconazole (Terazol) vaginal suppository.  Miconazole (Monistat) (may be purchased over the counter). SEEK MEDICAL CARE IF:   You have a temperature by mouth above 102 F (38.9 C).  The infection is getting worse after 2 days of treatment.  The infection is not getting better after 3 days of treatment.  You develop blisters in or around your vagina.  You develop vaginal bleeding, and it is not your menstrual period.  You have pain when you urinate.  You develop intestinal problems.  You have pain with sexual intercourse. Document Released: 05/22/2005 Document Revised: 11/04/2011 Document Reviewed: 02/03/2009 [facility] Patient Information 2015 [place], [place]. This information is not intended to replace advice given to you by your health care provider. Make sure you discuss any questions you have with your health care provider.

## 2015-01-17 LAB — CERVICOVAGINAL ANCILLARY ONLY
Chlamydia: NEGATIVE
Neisseria Gonorrhea: NEGATIVE
WET PREP (BD AFFIRM): POSITIVE — AB

## 2015-01-19 ENCOUNTER — Telehealth (HOSPITAL_COMMUNITY): Payer: Self-pay | Admitting: Family Medicine

## 2015-01-19 LAB — URINE CULTURE: Special Requests: NORMAL

## 2015-01-19 MED ORDER — CEPHALEXIN 500 MG PO CAPS: 500.0000 mg | ORAL_CAPSULE | Freq: Two times a day (BID) | ORAL | Status: DC

## 2015-01-19 MED ORDER — FLUCONAZOLE 150 MG PO TABS: 150.0000 mg | ORAL_TABLET | Freq: Once | ORAL | Status: DC

## 2015-01-19 NOTE — ED Notes (Signed)
Patient called back asking for culture results. Call in Keflex and a new prescription for fluconazole. Return as needed.   Rodolph BongEvan S Corey, MD 01/19/15 [phone]

## 2015-01-27 NOTE — ED Notes (Signed)
Final reports discussed with patient by MD. No further action required

## 2015-08-14 ENCOUNTER — Emergency Department (HOSPITAL_COMMUNITY): Payer: Medicaid Other

## 2015-08-14 ENCOUNTER — Encounter (HOSPITAL_COMMUNITY): Payer: Self-pay | Admitting: Cardiology

## 2015-08-14 ENCOUNTER — Emergency Department (HOSPITAL_COMMUNITY)
Admission: EM | Admit: 2015-08-14 | Discharge: 2015-08-14 | Disposition: A | Discharge status: Home / Self Care | Payer: Self-pay | Attending: Emergency Medicine | Admitting: Emergency Medicine

## 2015-08-14 DIAGNOSIS — R102 Pelvic and perineal pain: Secondary | ICD-10-CM

## 2015-08-14 DIAGNOSIS — Z3490 Encounter for supervision of normal pregnancy, unspecified, unspecified trimester: Secondary | ICD-10-CM

## 2015-08-14 DIAGNOSIS — Z8619 Personal history of other infectious and parasitic diseases: Secondary | ICD-10-CM | POA: Insufficient documentation

## 2015-08-14 DIAGNOSIS — O2341 Unspecified infection of urinary tract in pregnancy, first trimester: Secondary | ICD-10-CM

## 2015-08-14 DIAGNOSIS — Z792 Long term (current) use of antibiotics: Secondary | ICD-10-CM | POA: Insufficient documentation

## 2015-08-14 DIAGNOSIS — Z3A13 13 weeks gestation of pregnancy: Secondary | ICD-10-CM | POA: Insufficient documentation

## 2015-08-14 DIAGNOSIS — Z79899 Other long term (current) drug therapy: Secondary | ICD-10-CM | POA: Insufficient documentation

## 2015-08-14 DIAGNOSIS — Z8679 Personal history of other diseases of the circulatory system: Secondary | ICD-10-CM | POA: Insufficient documentation

## 2015-08-14 DIAGNOSIS — R112 Nausea with vomiting, unspecified: Secondary | ICD-10-CM

## 2015-08-14 DIAGNOSIS — R51 Headache: Secondary | ICD-10-CM | POA: Insufficient documentation

## 2015-08-14 DIAGNOSIS — R079 Chest pain, unspecified: Secondary | ICD-10-CM | POA: Insufficient documentation

## 2015-08-14 DIAGNOSIS — O210 Mild hyperemesis gravidarum: Secondary | ICD-10-CM | POA: Insufficient documentation

## 2015-08-14 DIAGNOSIS — O26899 Other specified pregnancy related conditions, unspecified trimester: Secondary | ICD-10-CM

## 2015-08-14 DIAGNOSIS — O0001 Abdominal pregnancy with intrauterine pregnancy: Secondary | ICD-10-CM | POA: Insufficient documentation

## 2015-08-14 DIAGNOSIS — R197 Diarrhea, unspecified: Secondary | ICD-10-CM | POA: Insufficient documentation

## 2015-08-14 DIAGNOSIS — R1084 Generalized abdominal pain: Secondary | ICD-10-CM

## 2015-08-14 DIAGNOSIS — H53149 Visual discomfort, unspecified: Secondary | ICD-10-CM | POA: Insufficient documentation

## 2015-08-14 DIAGNOSIS — O9989 Other specified diseases and conditions complicating pregnancy, childbirth and the puerperium: Secondary | ICD-10-CM | POA: Insufficient documentation

## 2015-08-14 LAB — I-STAT BETA HCG BLOOD, ED (MC, WL, AP ONLY): I-stat hCG, quantitative: >2000 m[IU]/mL — ABNORMAL HIGH (ref <5)

## 2015-08-14 LAB — URINALYSIS, ROUTINE W REFLEX MICROSCOPIC
BILIRUBIN URINE: NEGATIVE
Glucose, UA: NEGATIVE mg/dL
Hgb urine dipstick: NEGATIVE
Ketones, ur: >80 mg/dL — AB
Nitrite: POSITIVE — AB
Protein, ur: NEGATIVE mg/dL
Specific Gravity, Urine: 1.023 (ref 1.005–1.030)
pH: 6 (ref 5.0–8.0)

## 2015-08-14 LAB — CBC
HCT: 39.8 % (ref 36.0–46.0)
Hemoglobin: 13.4 g/dL (ref 12.0–15.0)
MCH: 27.9 pg (ref 26.0–34.0)
MCHC: 33.7 g/dL (ref 30.0–36.0)
MCV: 82.7 fL (ref 78.0–100.0)
PLATELETS: 275 10*3/uL (ref 150–400)
RBC: 4.81 MIL/uL (ref 3.87–5.11)
RDW: 12.9 % (ref 11.5–15.5)
WBC: 12.1 10*3/uL — ABNORMAL HIGH (ref 4.0–10.5)

## 2015-08-14 LAB — ABO/RH: ABO/RH(D): A POS

## 2015-08-14 LAB — URINE MICROSCOPIC-ADD ON: RBC / HPF: NONE SEEN RBC/hpf (ref 0–5)

## 2015-08-14 LAB — COMPREHENSIVE METABOLIC PANEL
ALK PHOS: 70 U/L (ref 38–126)
ALT: 17 U/L (ref 14–54)
AST: 15 U/L (ref 15–41)
Albumin: 3.8 g/dL (ref 3.5–5.0)
Anion gap: 12 (ref 5–15)
BUN: 9 mg/dL (ref 6–20)
CALCIUM: 9.6 mg/dL (ref 8.9–10.3)
CO2: 18 mmol/L — AB (ref 22–32)
CREATININE: 0.47 mg/dL (ref 0.44–1.00)
Chloride: 106 mmol/L (ref 101–111)
GFR calc Af Amer: >60 mL/min (ref >60)
GFR calc non Af Amer: >60 mL/min (ref >60)
GLUCOSE: 80 mg/dL (ref 65–99)
Potassium: 3.7 mmol/L (ref 3.5–5.1)
Sodium: 136 mmol/L (ref 135–145)
Total Bilirubin: 0.2 mg/dL — ABNORMAL LOW (ref 0.3–1.2)
Total Protein: 7.5 g/dL (ref 6.5–8.1)

## 2015-08-14 LAB — WET PREP, GENITAL
Clue Cells Wet Prep HPF POC: NONE SEEN
Sperm: NONE SEEN
TRICH WET PREP: NONE SEEN
Yeast Wet Prep HPF POC: NONE SEEN

## 2015-08-14 LAB — LIPASE, BLOOD: Lipase: 28 U/L (ref 11–51)

## 2015-08-14 LAB — HCG, QUANTITATIVE, PREGNANCY: hCG, Beta Chain, Quant, S: 141820 m[IU]/mL — ABNORMAL HIGH (ref <5)

## 2015-08-14 MED ORDER — NITROFURANTOIN MONOHYD MACRO 100 MG PO CAPS: 100.0000 mg | ORAL_CAPSULE | Freq: Two times a day (BID) | ORAL | Status: DC

## 2015-08-14 MED ORDER — DIPHENHYDRAMINE HCL 50 MG/ML IJ SOLN
25.0000 mg | Freq: Once | INTRAMUSCULAR | Status: AC
  Administered 2015-08-14: 25 mg via INTRAVENOUS
  Filled 2015-08-14: qty 1

## 2015-08-14 MED ORDER — DICYCLOMINE HCL 10 MG PO CAPS
10.0000 mg | ORAL_CAPSULE | Freq: Once | ORAL | Status: DC
  Filled 2015-08-14: qty 1

## 2015-08-14 MED ORDER — METOCLOPRAMIDE HCL 10 MG PO TABS: 10.0000 mg | ORAL_TABLET | Freq: Three times a day (TID) | ORAL | Status: DC | PRN

## 2015-08-14 MED ORDER — PRENATAL MULTIVITAMIN CH: 1.0000 | ORAL_TABLET | Freq: Every morning | ORAL | Status: AC

## 2015-08-14 MED ORDER — SODIUM CHLORIDE 0.9 % IV BOLUS (SEPSIS)
1000.0000 mL | Freq: Once | INTRAVENOUS | Status: AC
  Administered 2015-08-14: 1000 mL via INTRAVENOUS

## 2015-08-14 MED ORDER — METOCLOPRAMIDE HCL 5 MG/ML IJ SOLN
10.0000 mg | Freq: Once | INTRAMUSCULAR | Status: AC
  Administered 2015-08-14: 10 mg via INTRAVENOUS
  Filled 2015-08-14: qty 2

## 2015-08-14 NOTE — Discharge Instructions (Signed)
Read the information below.  Use the prescribed medication as directed.  Please discuss all new medications with your pharmacist.  You may return to the Emergency Department at any time for worsening condition or any new symptoms that concern you.    If you develop high fevers, worsening abdominal pain, uncontrolled vomiting, or are unable to tolerate fluids by mouth, return to the ER for a recheck.   ° ° °Abdominal Pain, Adult °Many things can cause abdominal pain. Usually, abdominal pain is not caused by a disease and will improve without treatment. It can often be observed and treated at home. Your health care provider will do a physical exam and possibly order blood tests and X-rays to help determine the seriousness of your pain. However, in many cases, more time must pass before a clear cause of the pain can be found. Before that point, your health care provider may not know if you need more testing or further treatment. °HOME CARE INSTRUCTIONS °Monitor your abdominal pain for any changes. The following actions may help to alleviate any discomfort you are experiencing: °· Only take over-the-counter or prescription medicines as directed by your health care provider. °· Do not take laxatives unless directed to do so by your health care provider. °· Try a clear liquid diet (broth, tea, or water) as directed by your health care provider. Slowly move to a bland diet as tolerated. °SEEK MEDICAL CARE IF: °· You have unexplained abdominal pain. °· You have abdominal pain associated with nausea or diarrhea. °· You have pain when you urinate or have a bowel movement. °· You experience abdominal pain that wakes you in the night. °· You have abdominal pain that is worsened or improved by eating food. °· You have abdominal pain that is worsened with eating fatty foods. °· You have a fever. °SEEK IMMEDIATE MEDICAL CARE IF: °· Your pain does not go away within 2 hours. °· You keep throwing up (vomiting). °· Your pain is felt  only in portions of the abdomen, such as the right side or the left lower portion of the abdomen. °· You pass bloody or black tarry stools. °MAKE SURE YOU: °· Understand these instructions. °· Will watch your condition. °· Will get help right away if you are not doing well or get worse. °  °This information is not intended to replace advice given to you by your health care provider. Make sure you discuss any questions you have with your health care provider. °  °Document Released: 05/22/2005 Document Revised: 05/03/2015 Document Reviewed: 04/21/2013 °Elsevier Interactive Patient Education ©2016 [employer] ° °Nausea and Vomiting °Nausea is a sick feeling that often comes before throwing up (vomiting). Vomiting is a reflex where stomach contents come out of your mouth. Vomiting can cause severe loss of body fluids (dehydration). Children and elderly adults can become dehydrated quickly, especially if they also have diarrhea. Nausea and vomiting are symptoms of a condition or disease. It is important to find the cause of your symptoms. °CAUSES  °· Direct irritation of the stomach lining. This irritation can result from increased acid production (gastroesophageal reflux disease), infection, food poisoning, taking certain medicines (such as nonsteroidal anti-inflammatory drugs), alcohol use, or tobacco use. °· Signals from the brain. These signals could be caused by a headache, heat exposure, an inner ear disturbance, increased pressure in the brain from injury, infection, a tumor, or a concussion, pain, emotional stimulus, or metabolic problems. °· An obstruction in the gastrointestinal tract (bowel obstruction). °· Illnesses such   as diabetes, hepatitis, gallbladder problems, appendicitis, kidney problems, cancer, sepsis, atypical symptoms of a heart attack, or eating disorders. °· Medical treatments such as chemotherapy and radiation. °· Receiving medicine that makes you sleep (general anesthetic) during  surgery. °DIAGNOSIS °Your caregiver may ask for tests to be done if the problems do not improve after a few days. Tests may also be done if symptoms are severe or if the reason for the nausea and vomiting is not clear. Tests may include: °· Urine tests. °· Blood tests. °· Stool tests. °· Cultures (to look for evidence of infection). °· X-rays or other imaging studies. °Test results can help your caregiver make decisions about treatment or the need for additional tests. °TREATMENT °You need to stay well hydrated. Drink frequently but in small amounts. You may wish to drink water, sports drinks, clear broth, or eat frozen ice pops or gelatin dessert to help stay hydrated. When you eat, eating slowly may help prevent nausea. There are also some antinausea medicines that may help prevent nausea. °HOME CARE INSTRUCTIONS  °· Take all medicine as directed by your caregiver. °· If you do not have an appetite, do not force yourself to eat. However, you must continue to drink fluids. °· If you have an appetite, eat a normal diet unless your caregiver tells you differently. °¨ Eat a variety of complex carbohydrates (rice, wheat, potatoes, bread), lean meats, yogurt, fruits, and vegetables. °¨ Avoid high-fat foods because they are more difficult to digest. °· Drink enough water and fluids to keep your urine clear or pale yellow. °· If you are dehydrated, ask your caregiver for specific rehydration instructions. Signs of dehydration may include: °¨ Severe thirst. °¨ Dry lips and mouth. °¨ Dizziness. °¨ Dark urine. °¨ Decreasing urine frequency and amount. °¨ Confusion. °¨ Rapid breathing or pulse. °SEEK IMMEDIATE MEDICAL CARE IF:  °· You have blood or brown flecks (like coffee grounds) in your vomit. °· You have black or bloody stools. °· You have a severe headache or stiff neck. °· You are confused. °· You have severe abdominal pain. °· You have chest pain or trouble breathing. °· You do not urinate at least once every 8  hours. °· You develop cold or clammy skin. °· You continue to vomit for longer than 24 to 48 hours. °· You have a fever. °MAKE SURE YOU:  °· Understand these instructions. °· Will watch your condition. °· Will get help right away if you are not doing well or get worse. °  °This information is not intended to replace advice given to you by your health care provider. Make sure you discuss any questions you have with your health care provider. °  °Document Released: 08/12/2005 Document Revised: 11/04/2011 Document Reviewed: 01/09/2011 °Elsevier Interactive Patient Education ©2016 [employer] ° °

## 2015-08-14 NOTE — ED Notes (Signed)
Pt reports abd pain and vomiting that started this morning. Reports she has been unable to keep anything down.

## 2015-08-14 NOTE — ED Provider Notes (Signed)
CSN: [id number]     Arrival date & time 08/14/15  1700 History  By signing my name below, I, Placido Sou, attest that this documentation has been prepared under the direction and in the presence of [facility], PA-C. Electronically Signed: Placido Sou, ED Scribe. 08/14/2015. 7:21 PM.   Chief Complaint  Patient presents with  . Abdominal Pain  . Emesis   The history is provided by the patient. No language interpreter was used.   HPI Comments: Shelby Olsen is a 19 y.o. female who presents to the Emergency Department complaining of constant, moderate, lower abd pain with onset this morning. Pt notes associated n/v/d, chills, HA and photophobia further noting worsening symptoms of her n/v/d with any food or fluid intake. She notes some associated CP that alleviates when taking deep breaths further noting some specks of blood in her vomit earlier today. She notes 10x diarrhea today. Pt is unsure of her LNMP further noting that she has irregular menstrual cycles. Pt denies any prior surgery to her abdomen. She notes her significant other is sick as well and is experiencing similar symptoms  (N/V/D, abdominal pain). Pt notes a hx including 3 pregnancies further noting 1 died during childbirth and with the most recent she was induced 3 weeks prior to her delivery date.   [phone] including this pregnancy) Denies any other complications with the pregnancies.  She denies fevers, bloody stool, rectal bleeding, vaginal discharge and vaginal bleeding, dysuria.   Past Medical History  Diagnosis Date  . Migraine   . Hx of measles    Past Surgical History  Procedure Laterality Date  . No past surgeries     Family History  Problem Relation Age of Onset  . Hypertension Mother   . Asthma Sister   . Asthma Brother    Social History  Substance Use Topics  . Smoking status: Never Smoker   . Smokeless tobacco: Never Used  . Alcohol Use: No   OB History    Gravida Para Term Preterm AB TAB  SAB Ectopic Multiple Living   0 0 0 0 0 0 2     Review of Systems  Constitutional: Positive for chills. Negative for fever.  Eyes: Positive for photophobia.  Cardiovascular: Positive for chest pain.  Gastrointestinal: Positive for nausea, vomiting, abdominal pain and diarrhea. Negative for constipation, blood in stool and anal bleeding.  Genitourinary: Negative for vaginal bleeding and vaginal discharge.  Neurological: Positive for headaches.  All other systems reviewed and are negative.  Allergies  Peanuts  Home Medications   Prior to Admission medications   Medication Sig Start Date End Date Taking? Authorizing Provider  acetaminophen (TYLENOL) 325 MG tablet Take 650 mg by mouth every 6 (six) hours as needed for headache.    Historical Provider, MD  cephALEXin (KEFLEX) 500 MG capsule Take 1 capsule (500 mg total) by mouth 2 (two) times daily. 01/19/15   Rodolph Bong, MD  fluconazole (DIFLUCAN) 150 MG tablet Take 1 tablet (150 mg total) by mouth once. 01/19/15   Rodolph Bong, MD  ibuprofen (ADVIL,MOTRIN) 600 MG tablet Take 1 tablet (600 mg total) by mouth every 6 (six) hours as needed. 12/10/14   Nigel Bridgeman, CNM  Prenatal Vit-Fe Fumarate-FA (PRENATAL MULTIVITAMIN) TABS tablet Take 1 tablet by mouth every morning.    Historical Provider, MD   BP 126/86 mmHg  Pulse 103  Temp(Src) 98.8 F (37.1 C)  Resp 18  Ht  (1.575 m)  Wt 197 lb (89.359 kg)  BMI 36.02 kg/m2  SpO2 99% Physical Exam  Constitutional: She appears well-developed and well-nourished. No distress.  HENT:  Head: Normocephalic and atraumatic.  Eyes: Conjunctivae are normal.  Neck: Normal range of motion. Neck supple.  Cardiovascular: Normal rate and regular rhythm.   Pulmonary/Chest: Effort normal and breath sounds normal.  Abdominal: Soft. Bowel sounds are normal. She exhibits no distension and no mass. There is tenderness. There is no rebound and no guarding.  Generalized tenderness that is worse in the  suprapubic region  Genitourinary: Uterus is enlarged and tender. Cervix exhibits no motion tenderness. Right adnexum displays no mass, no tenderness and no fullness. Left adnexum displays no mass, no tenderness and no fullness. There is tenderness in the vagina. No erythema or bleeding in the vagina. No foreign body around the vagina. No signs of injury around the vagina. Vaginal discharge found.  White discharge  Neurological: She is alert. She exhibits normal muscle tone.  Skin: She is not diaphoretic.  Nursing note and vitals reviewed.  ED Course  Procedures  DIAGNOSTIC STUDIES: Oxygen Saturation is 99% on RA, normal by my interpretation.    COORDINATION OF CARE: 7:19 PM Pt presents today due to abd pain. Discussed lab results and next steps with pt and she agreed to the plan.   Labs Review Labs Reviewed  WET PREP, GENITAL - Abnormal; Notable for the following:    WBC, Wet Prep HPF POC MODERATE (*)    All other components within normal limits  COMPREHENSIVE METABOLIC PANEL - Abnormal; Notable for the following:    CO2 18 (*)    Total Bilirubin 0.2 (*)    All other components within normal limits  CBC - Abnormal; Notable for the following:    WBC 12.1 (*)    All other components within normal limits  URINALYSIS, ROUTINE W REFLEX MICROSCOPIC (NOT AT [facility]) - Abnormal; Notable for the following:    APPearance HAZY (*)    Ketones, ur >80 (*)    Nitrite POSITIVE (*)    Leukocytes, UA MODERATE (*)    All other components within normal limits  HCG, QUANTITATIVE, PREGNANCY - Abnormal; Notable for the following:    hCG, Beta Chain, Quant, S [id number] (*)    All other components within normal limits  URINE MICROSCOPIC-ADD ON - Abnormal; Notable for the following:    Squamous Epithelial / LPF 0-5 (*)    Bacteria, UA MANY (*)    All other components within normal limits  I-STAT BETA HCG BLOOD, ED ([facility], [facility], [facility] ONLY) - Abnormal; Notable for the following:    I-stat hCG, quantitative >2000.0  (*)    All other components within normal limits  LIPASE, BLOOD  HIV ANTIBODY (ROUTINE TESTING)  ABO/RH  GC/CHLAMYDIA PROBE AMP ([place]) NOT AT [facility]   Imaging Review [place] Ob Comp Less 14 Wks  08/14/2015  CLINICAL DATA:  Abdominal pain in first-trimester pregnancy. Unknown last menstrual period. EXAM: OBSTETRIC <14 WK US AND TRANSVAGINAL OB US TECHNIQUE: Both transabdominal and transvaginal ultrasound examinations were performed for complete evaluation of the gestation as well as the maternal uterus, adnexal regions, and pelvic cul-de-sac. Transvaginal technique was performed to assess early pregnancy. COMPARISON:  11/28/2014 FINDINGS: Intrauterine gestational sac: Visualized/normal in shape. Yolk sac:  No longer seen Embryo:  Present Cardiac Activity: Present Heart Rate: 169   bpm CRL:  62.7  mm   12 w   5 d  [place] EDC: 02/21/2015 Subchorionic hemorrhage:  None. Maternal uterus/adnexae: Ovaries not seen. No adnexal mass. There are prominent uterine vessels, also seen on scan 06/23/2014. IMPRESSION: 1. Single living intrauterine gestation measuring 12 weeks 5 days. 2. No acute finding. Electronically Signed   By: Marnee Spring M.D.   On: 08/14/2015 22:00   US Ob Transvaginal  08/14/2015  CLINICAL DATA:  Abdominal pain in first-trimester pregnancy. Unknown last menstrual period. EXAM: OBSTETRIC <14 WK [place] AND TRANSVAGINAL OB US TECHNIQUE: Both transabdominal and transvaginal ultrasound examinations were performed for complete evaluation of the gestation as well as the maternal uterus, adnexal regions, and pelvic cul-de-sac. Transvaginal technique was performed to assess early pregnancy. COMPARISON:  11/28/2014 FINDINGS: Intrauterine gestational sac: Visualized/normal in shape. Yolk sac:  No longer seen Embryo:  Present Cardiac Activity: Present Heart Rate: 169   bpm CRL:  62.7  mm   12 w   5 d                  [place] EDC: 02/21/2015 Subchorionic hemorrhage:  None. Maternal uterus/adnexae:  Ovaries not seen. No adnexal mass. There are prominent uterine vessels, also seen on scan 06/23/2014. IMPRESSION: 1. Single living intrauterine gestation measuring 12 weeks 5 days. 2. No acute finding. Electronically Signed   By: Marnee Spring M.D.   On: 08/14/2015 22:00   I have personally reviewed and evaluated these images and lab results as part of my medical decision-making.   EKG Interpretation None       I have discussed with patient the safety of medications in pregnancy, the fact that our knowledge of drug safety in pregnancy is limited and that I can only make recommendations based on what is currently known at this time and the safety ratings given to medications.  We discussed that medications may pass through the placenta and have encouraged them to make their own decisions regarding the medications used in light of this information.    MDM   Final diagnoses:  Generalized abdominal pain  Nausea vomiting and diarrhea  Intrauterine pregnancy  UTI in pregnancy, antepartum, first trimester    Afebrile nontoxic patient with N/V/D and abdominal pain that began today, significant other with similar symptoms that also began today.  Likely viral.  Incidentally found to be pregnant, nearly into her second trimester, measuring at 12 weeks, 5 days.  Given IVF, diphenhydramine, Reglan for nausea.  Bentyl.  Medications are category B.  Pt also having increased urination, hx UTIs in pregnancy and she feels the increased urination is abnormal.  Her UA shows only 0-5 squamous cells, 6-30 WBC, many bacterial, nitrite positive and moderate leukocytes.  Will treat with macrobid.  Tolerating PO including fluids and crackers.  Pt has been [facility] at [facility] for prior pregnancies, she will call them tomorrow to schedule an appointment.  D/C home with prenatal vitamins, reglan, macrobid, OB follow up.  Discussed result, findings, treatment, and follow up  with patient.  Pt given return  precautions.  Pt verbalizes understanding and agrees with plan.       I personally performed the services described in this documentation, which was scribed in my presence. The recorded information has been reviewed and is accurate.     Trixie Dredge, PA-C 08/14/15 2251  Lavera Guise, MD 08/15/15 [phone]

## 2015-08-15 LAB — HIV ANTIBODY (ROUTINE TESTING W REFLEX): HIV SCREEN 4TH GENERATION: NONREACTIVE

## 2015-08-15 LAB — GC/CHLAMYDIA PROBE AMP (~~LOC~~) NOT AT ARMC
Chlamydia: POSITIVE — AB
Neisseria Gonorrhea: NEGATIVE

## 2015-08-16 ENCOUNTER — Telehealth (HOSPITAL_COMMUNITY): Payer: Self-pay

## 2015-08-16 NOTE — Telephone Encounter (Signed)
Positive for chlaymdia. Chart sent to EDP office for review.

## 2015-08-18 ENCOUNTER — Telehealth (HOSPITAL_BASED_OUTPATIENT_CLINIC_OR_DEPARTMENT_OTHER): Payer: Self-pay | Admitting: Emergency Medicine

## 2015-08-19 ENCOUNTER — Telehealth: Payer: Self-pay

## 2015-08-22 ENCOUNTER — Telehealth (HOSPITAL_COMMUNITY): Payer: Self-pay

## 2015-08-22 NOTE — Telephone Encounter (Signed)
Subscriber not available.  [facility].

## 2015-08-27 NOTE — L&D Delivery Note (Signed)
Patient is 20 y.o. [id number] 2036w5d admitted for induction of labor due to h/o IUFD at term   Delivery Note At 8:18 PM a viable female was delivered via Vaginal, Spontaneous Delivery (Presentation: Middle Occiput Anterior).  APGAR: 7, 8; weight  Placenta status: Intact, Spontaneous.  Cord: 3 vessels with the following complications: None.  Cord pH: n/a  Anesthesia:  none Episiotomy:  none Lacerations: periurethral bruise Suture Repair: n/a Est. Blood Loss (mL): 50 ml  Mom to postpartum.  Baby to Couplet care / Skin to Skin.  Almon Herculesaye T Gonfa 02/12/2016, 8:31 PM

## 2015-08-29 ENCOUNTER — Telehealth (HOSPITAL_COMMUNITY): Payer: Self-pay

## 2015-08-29 NOTE — Telephone Encounter (Signed)
Unable to reach by phone or mail.  Chart closed.   

## 2015-12-19 ENCOUNTER — Encounter (HOSPITAL_COMMUNITY): Payer: Self-pay | Admitting: *Deleted

## 2015-12-19 ENCOUNTER — Inpatient Hospital Stay (HOSPITAL_COMMUNITY)
Admission: AD | Admit: 2015-12-19 | Discharge: 2015-12-19 | Disposition: A | Discharge status: Home / Self Care | Payer: Medicaid Other | Source: Ambulatory Visit | Attending: Obstetrics & Gynecology | Admitting: Obstetrics & Gynecology

## 2015-12-19 DIAGNOSIS — Z91010 Allergy to peanuts: Secondary | ICD-10-CM | POA: Diagnosis not present

## 2015-12-19 DIAGNOSIS — R319 Hematuria, unspecified: Secondary | ICD-10-CM

## 2015-12-19 DIAGNOSIS — O98813 Other maternal infectious and parasitic diseases complicating pregnancy, third trimester: Secondary | ICD-10-CM | POA: Insufficient documentation

## 2015-12-19 DIAGNOSIS — A749 Chlamydial infection, unspecified: Secondary | ICD-10-CM

## 2015-12-19 DIAGNOSIS — O26893 Other specified pregnancy related conditions, third trimester: Secondary | ICD-10-CM | POA: Diagnosis not present

## 2015-12-19 DIAGNOSIS — Z3A31 31 weeks gestation of pregnancy: Secondary | ICD-10-CM | POA: Diagnosis not present

## 2015-12-19 LAB — URINALYSIS, ROUTINE W REFLEX MICROSCOPIC
BILIRUBIN URINE: NEGATIVE
Glucose, UA: NEGATIVE mg/dL
Ketones, ur: NEGATIVE mg/dL
NITRITE: NEGATIVE
PROTEIN: NEGATIVE mg/dL
Specific Gravity, Urine: 1.01 (ref 1.005–1.030)
pH: 7 (ref 5.0–8.0)

## 2015-12-19 LAB — CBC
HEMATOCRIT: 32.8 % — AB (ref 36.0–46.0)
Hemoglobin: 10.9 g/dL — ABNORMAL LOW (ref 12.0–15.0)
MCH: 27.4 pg (ref 26.0–34.0)
MCHC: 33.2 g/dL (ref 30.0–36.0)
MCV: 82.4 fL (ref 78.0–100.0)
PLATELETS: 226 10*3/uL (ref 150–400)
RBC: 3.98 MIL/uL (ref 3.87–5.11)
RDW: 12.7 % (ref 11.5–15.5)
WBC: 10.6 10*3/uL — ABNORMAL HIGH (ref 4.0–10.5)

## 2015-12-19 LAB — URINE MICROSCOPIC-ADD ON

## 2015-12-19 LAB — WET PREP, GENITAL
Clue Cells Wet Prep HPF POC: NONE SEEN
Sperm: NONE SEEN
Trich, Wet Prep: NONE SEEN
Yeast Wet Prep HPF POC: NONE SEEN

## 2015-12-19 MED ORDER — LACTATED RINGERS IV BOLUS (SEPSIS)
1000.0000 mL | Freq: Once | INTRAVENOUS | Status: AC
  Administered 2015-12-19: 1000 mL via INTRAVENOUS

## 2015-12-19 NOTE — MAU Provider Note (Signed)
Shelby Olsen is a 20yo, [id number], at 31.6 wks presenting to MAU unannounced for blood in her urine and blood on her tissue when she wipes. However, she denies vaginal bleeding or vaginal discharge (no itching, no odor).  States +FM.  Reports occasional contractions with no consistency or pattern and is not able to verbalize a frequency.   Reports urinary frequency and difficulty emptying her bladdder with "clotting" this AM and a "throbbing down low" when she feels the urge to pee or when baby is moving.  Last IC is last week.      History     Patient Active Problem List   Diagnosis Date Noted  . Vaginal delivery 12/08/2014  . History of migraine headaches - no meds 12/07/2014  . Irregular periods/menstrual cycles 12/07/2014  . Short stature 12/07/2014  . IUFD (intrauterine fetal death) - in previous pregnancy 12/06/2014  . BMI 39.0-39.9,adult 12/06/2014  . Late prenatal care 12/06/2014    Chief Complaint  Patient presents with  . "blood when I pee"    HPI  OB History    Gravida Para Term Preterm AB TAB SAB Ectopic Multiple Living   4 3 3  0 0 0 0 0 0 2      Past Medical History  Diagnosis Date  . Migraine   . Hx of measles     Past Surgical History  Procedure Laterality Date  . No past surgeries      Family History  Problem Relation Age of Onset  . Hypertension Mother   . Asthma Sister   . Asthma Brother     Social History  Substance Use Topics  . Smoking status: Never Smoker   . Smokeless tobacco: Never Used  . Alcohol Use: No    Allergies:  Allergies  Allergen Reactions  . Peanuts [Peanut Oil] Anaphylaxis and Hives    No prescriptions prior to admission    ROS Physical Exam   Blood pressure 119/72, pulse 105, temperature 98.4 F (36.9 C), temperature source Oral, resp. rate 16, currently breastfeeding.  Results for orders placed or performed during the hospital encounter of 12/19/15 (from the past 48 hour(s))  GC/Chlamydia probe amp ([facility])not at [facility]     Status: Abnormal   Collection Time: 12/19/15 12:00 AM  Result Value Ref Range   Chlamydia **POSITIVE** (A)     Comment: Normal Reference Range - Negative   Neisseria gonorrhea Negative     Comment: Normal Reference Range - Negative  Urine culture     Status: Abnormal   Collection Time: 12/19/15  8:22 AM  Result Value Ref Range   Specimen Description URINE, CLEAN CATCH    Special Requests Normal    Culture MULTIPLE SPECIES PRESENT, SUGGEST RECOLLECTION (A)    Report Status 12/20/2015 FINAL   Urinalysis, Routine w reflex microscopic (not at [facility])     Status: Abnormal   Collection Time: 12/19/15  8:50 AM  Result Value Ref Range   Color, Urine RED (A) YELLOW    Comment: BIOCHEMICALS MAY BE AFFECTED BY COLOR   APPearance CLOUDY (A) CLEAR   Specific Gravity, Urine 1.010 1.005 - 1.030   pH 7.0 5.0 - 8.0   Glucose, UA NEGATIVE NEGATIVE mg/dL   Hgb urine dipstick LARGE (A) NEGATIVE   Bilirubin Urine NEGATIVE NEGATIVE   Ketones, ur NEGATIVE NEGATIVE mg/dL   Protein, ur NEGATIVE NEGATIVE mg/dL   Nitrite NEGATIVE NEGATIVE   Leukocytes, UA SMALL (A) NEGATIVE  Urine microscopic-add on  Status: Abnormal   Collection Time: 12/19/15  8:50 AM  Result Value Ref Range   Squamous Epithelial / LPF 0-5 (A) NONE SEEN   WBC, UA 0-5 0 - 5 WBC/hpf   RBC / HPF TOO NUMEROUS TO COUNT 0 - 5 RBC/hpf   Bacteria, UA FEW (A) NONE SEEN  CBC     Status: Abnormal   Collection Time: 12/19/15 12:44 PM  Result Value Ref Range   WBC 10.6 (H) 4.0 - 10.5 K/uL   RBC 3.98 3.87 - 5.11 MIL/uL   Hemoglobin 10.9 (L) 12.0 - 15.0 g/dL   HCT 60.4 (L) 54.0 - 98.1 %   MCV 82.4 78.0 - 100.0 fL   MCH 27.4 26.0 - 34.0 pg   MCHC 33.2 30.0 - 36.0 g/dL   RDW 19.1 47.8 - 29.5 %   Platelets 226 150 - 400 K/uL  Wet prep, genital     Status: Abnormal   Collection Time: 12/19/15  1:13 PM  Result Value Ref Range   Yeast Wet Prep HPF POC NONE SEEN NONE SEEN   Trich, Wet Prep NONE SEEN NONE SEEN   Clue  Cells Wet Prep HPF POC NONE SEEN NONE SEEN   WBC, Wet Prep HPF POC MANY (A) NONE SEEN    Comment: BACTERIA- TOO NUMEROUS TO COUNT   Sperm NONE SEEN      FHT:   Cat 1, 145 bpm, moderate variability, +accels, no decels UC:  rare SVE: 0/0/-4  Physical Exam  Constitutional: She is oriented to person, place, and time. She appears well-developed.  HENT:  Head: Normocephalic.  Eyes: Pupils are equal, round, and reactive to light.  Neck: Normal range of motion.  Cardiovascular: Normal rate and regular rhythm.   Respiratory: Effort normal.  GI: Soft.  Genitourinary: No bleeding in the vagina. Vaginal discharge found.  Musculoskeletal: Normal range of motion.  Neurological: She is alert and oriented to person, place, and time. She has normal reflexes.  Skin: Skin is warm and dry.  Psychiatric: She has a normal mood and affect. Her behavior is normal. Judgment and thought content normal.    ED Course  Assessment: iup 31.6 wks Blood in urine Wet prep negative UA negative    Plan: Urine Culture  pending GC/CH pending at the time of discharge Referral to urologist for [facility] Will call pt with results of Cultures , if positive  Keep/Go to Prenatal visits Call PRN  Note: Pt left MAU without discharge paperwork   Alphonzo Severance, CNM, MSN 12/19/15  1200pm   Update:  (12/20/15) Chlamydia/Gonorrhea cultures resulted -   Chlamydia positive  Rx  Azithromycin 1gram  Sent to pharmacy on record Attempted to call patient- unable to reach patient- phone on record does not take incoming calls.    Alphonzo Severance CNM, MSN 12/20/2015 9:22 PM

## 2015-12-19 NOTE — MAU Note (Signed)
States since last night, she has seen blood on tissue with wiping after she pees. States she is not wearing a pad. No blood in toilet, just on tissue. States she noticed some "clotting" this AM. C/O "throbbing down low" when she feels the urge to pee or when the baby is moving. Last intercourse about a week ago.

## 2015-12-20 LAB — GC/CHLAMYDIA PROBE AMP (~~LOC~~) NOT AT ARMC
Chlamydia: POSITIVE — AB
Neisseria Gonorrhea: NEGATIVE

## 2015-12-20 LAB — URINE CULTURE: SPECIAL REQUESTS: NORMAL

## 2015-12-20 MED ORDER — AZITHROMYCIN 500 MG PO TABS: 1000.0000 mg | ORAL_TABLET | Freq: Once | ORAL | Status: DC

## 2015-12-20 NOTE — Discharge Instructions (Signed)
Hematuria, Adult Hematuria is blood in your urine. It can be caused by a bladder infection, kidney infection, prostate infection, kidney stone, or cancer of your urinary tract. Infections can usually be treated with medicine, and a kidney stone usually will pass through your urine. If neither of these is the cause of your hematuria, further workup to find out the reason may be needed. It is very important that you tell your health care provider about any blood you see in your urine, even if the blood stops without treatment or happens without causing pain. Blood in your urine that happens and then stops and then happens again can be a symptom of a very serious condition. Also, pain is not a symptom in the initial stages of many urinary cancers. HOME CARE INSTRUCTIONS   Drink lots of fluid, 3-4 quarts a day. If you have been diagnosed with an infection, cranberry juice is especially recommended, in addition to large amounts of water.  Avoid caffeine, tea, and carbonated beverages because they tend to irritate the bladder.  Avoid alcohol because it may irritate the prostate.  Take all medicines as directed by your health care provider.  If you were prescribed an antibiotic medicine, finish it all even if you start to feel better.  If you have been diagnosed with a kidney stone, follow your health care provider's instructions regarding straining your urine to catch the stone.  Empty your bladder often. Avoid holding urine for long periods of time.  After a bowel movement, women should cleanse front to back. Use each tissue only once.  Empty your bladder before and after sexual intercourse if you are a female. SEEK MEDICAL CARE IF:  You develop back pain.  You have a fever.  You have a feeling of sickness in your stomach (nausea) or vomiting.  Your symptoms are not better in 3 days. Return sooner if you are getting worse. SEEK IMMEDIATE MEDICAL CARE IF:   You develop severe  vomiting and are unable to keep the medicine down.  You develop severe back or abdominal pain despite taking your medicines.  You begin passing a large amount of blood or clots in your urine.  You feel extremely weak or faint, or you pass out. MAKE SURE YOU:   Understand these instructions.  Will watch your condition.  Will get help right away if you are not doing well or get worse.   This information is not intended to replace advice given to you by your health care provider. Make sure you discuss any questions you have with your health care provider.   Document Released: 08/12/2005 Document Revised: 09/02/2014 Document Reviewed: 04/12/2013 Elsevier Interactive Patient Education 2016 Else[employer] Inc. Chlamydia, Female Chlamydia is an infection. It is spread through sexual contact. Chlamydia can be in different areas of the body. These areas include the cervix, urethra, throat, or rectum. You may not know you have chlamydia because many people never develop the symptoms. Chlamydia is not difficult to treat once you know you have it. However, if it is left untreated, chlamydia can lead to more serious health problems.  CAUSES  Chlamydia is caused by bacteria. It is a sexually transmitted disease. It is passed from an infected partner during intimate contact. This contact could be with the genitals, mouth, or rectal area. Chlamydia can also be passed from mothers to babies during birth. SIGNS AND SYMPTOMS  There may not be any symptoms. This is often the case early in the infection. If  symptoms develop, they may include:  Mild pain and discomfort when urinating.  Redness, soreness, and swelling (inflammation) of the rectum.  Vaginal discharge.  Painful intercourse.  Abdominal pain.  Bleeding between menstrual periods. DIAGNOSIS  To diagnose this infection, your health care provider will do a pelvic exam. Cultures will be taken of the vagina, cervix, urine, and possibly the rectum to  verify the diagnosis.  TREATMENT You will be given antibiotic medicines. If you are pregnant, certain types of antibiotics will need to be avoided. Any sexual partners should also be treated, even if they do not show symptoms. Your health care provider may test you for infection again 3 months after treatment. HOME CARE INSTRUCTIONS   Take your antibiotic medicine as directed by your health care provider. Finish the antibiotic even if you start to feel better.  Take medicines only as directed by your health care provider.  Inform any sexual partners about the infection. They should also be treated.  Do not have sexual contact until your health care provider tells you it is okay.  Get plenty of rest.  Eat a well-balanced diet.  Drink enough fluids to keep your urine clear or pale yellow.  Keep all follow-up visits as directed by your health care provider. SEEK MEDICAL CARE IF:  You have painful urination.  You have abdominal pain.  You have vaginal discharge.  You have painful sexual intercourse.  You have bleeding between periods and after sex.  You have a fever. SEEK IMMEDIATE MEDICAL CARE IF:   You experience nausea or vomiting.  You experience excessive sweating (diaphoresis).  You have difficulty swallowing.   This information is not intended to replace advice given to you by your health care provider. Make sure you discuss any questions you have with your health care provider.   Document Released: 05/22/2005 Document Revised: 05/03/2015 Document Reviewed: 04/19/2013 Elsevier Interactive Patient Education [employer]

## 2016-02-12 ENCOUNTER — Encounter (HOSPITAL_COMMUNITY): Payer: Self-pay

## 2016-02-12 ENCOUNTER — Inpatient Hospital Stay (HOSPITAL_COMMUNITY): Payer: Medicaid Other | Admitting: Anesthesiology

## 2016-02-12 ENCOUNTER — Inpatient Hospital Stay (HOSPITAL_COMMUNITY)
Admission: AD | Admit: 2016-02-12 | Discharge: 2016-02-14 | DRG: 774 | Disposition: A | Discharge status: Home / Self Care | Payer: Medicaid Other | Source: Ambulatory Visit | Attending: Obstetrics & Gynecology | Admitting: Obstetrics & Gynecology

## 2016-02-12 DIAGNOSIS — A568 Sexually transmitted chlamydial infection of other sites: Secondary | ICD-10-CM | POA: Diagnosis present

## 2016-02-12 DIAGNOSIS — IMO0001 Reserved for inherently not codable concepts without codable children: Secondary | ICD-10-CM

## 2016-02-12 DIAGNOSIS — Z8619 Personal history of other infectious and parasitic diseases: Secondary | ICD-10-CM | POA: Diagnosis not present

## 2016-02-12 DIAGNOSIS — Z825 Family history of asthma and other chronic lower respiratory diseases: Secondary | ICD-10-CM | POA: Diagnosis not present

## 2016-02-12 DIAGNOSIS — Z8249 Family history of ischemic heart disease and other diseases of the circulatory system: Secondary | ICD-10-CM | POA: Diagnosis not present

## 2016-02-12 DIAGNOSIS — Z3A39 39 weeks gestation of pregnancy: Secondary | ICD-10-CM

## 2016-02-12 DIAGNOSIS — A749 Chlamydial infection, unspecified: Secondary | ICD-10-CM | POA: Diagnosis present

## 2016-02-12 DIAGNOSIS — O0930 Supervision of pregnancy with insufficient antenatal care, unspecified trimester: Secondary | ICD-10-CM

## 2016-02-12 DIAGNOSIS — O9832 Other infections with a predominantly sexual mode of transmission complicating childbirth: Secondary | ICD-10-CM | POA: Diagnosis present

## 2016-02-12 DIAGNOSIS — Z8759 Personal history of other complications of pregnancy, childbirth and the puerperium: Secondary | ICD-10-CM | POA: Diagnosis present

## 2016-02-12 DIAGNOSIS — O98819 Other maternal infectious and parasitic diseases complicating pregnancy, unspecified trimester: Secondary | ICD-10-CM

## 2016-02-12 HISTORY — DX: Reserved for concepts with insufficient information to code with codable children: IMO0002

## 2016-02-12 LAB — WET PREP, GENITAL
CLUE CELLS WET PREP: NONE SEEN
SPERM: NONE SEEN
TRICH WET PREP: NONE SEEN
YEAST WET PREP: NONE SEEN

## 2016-02-12 LAB — RAPID URINE DRUG SCREEN, HOSP PERFORMED
Amphetamines: NOT DETECTED
BARBITURATES: NOT DETECTED
BENZODIAZEPINES: NOT DETECTED
Cocaine: NOT DETECTED
Opiates: NOT DETECTED
Tetrahydrocannabinol: NOT DETECTED

## 2016-02-12 LAB — TYPE AND SCREEN
ABO/RH(D): A POS
ANTIBODY SCREEN: NEGATIVE

## 2016-02-12 LAB — URINALYSIS, ROUTINE W REFLEX MICROSCOPIC
Bilirubin Urine: NEGATIVE
Glucose, UA: NEGATIVE mg/dL
Ketones, ur: NEGATIVE mg/dL
NITRITE: POSITIVE — AB
PROTEIN: NEGATIVE mg/dL
SPECIFIC GRAVITY, URINE: 1.02 (ref 1.005–1.030)
pH: 7 (ref 5.0–8.0)

## 2016-02-12 LAB — CBC
HCT: 38 % (ref 36.0–46.0)
HEMOGLOBIN: 12.6 g/dL (ref 12.0–15.0)
MCH: 27.3 pg (ref 26.0–34.0)
MCHC: 33.2 g/dL (ref 30.0–36.0)
MCV: 82.3 fL (ref 78.0–100.0)
PLATELETS: 244 10*3/uL (ref 150–400)
RBC: 4.62 MIL/uL (ref 3.87–5.11)
RDW: 14.1 % (ref 11.5–15.5)
WBC: 10.1 10*3/uL (ref 4.0–10.5)

## 2016-02-12 LAB — DIFFERENTIAL
BASOS ABS: 0 10*3/uL (ref 0.0–0.1)
Basophils Relative: 0 %
Eosinophils Absolute: 0 10*3/uL (ref 0.0–0.7)
Eosinophils Relative: 0 %
LYMPHS ABS: 2 10*3/uL (ref 0.7–4.0)
LYMPHS PCT: 20 %
Monocytes Absolute: 0.7 10*3/uL (ref 0.1–1.0)
Monocytes Relative: 7 %
NEUTROS ABS: 7.2 10*3/uL (ref 1.7–7.7)
Neutrophils Relative %: 73 %

## 2016-02-12 LAB — URINE MICROSCOPIC-ADD ON

## 2016-02-12 LAB — RAPID HIV SCREEN (HIV 1/2 AB+AG)
HIV 1/2 ANTIBODIES: NONREACTIVE
HIV-1 P24 ANTIGEN - HIV24: NONREACTIVE

## 2016-02-12 LAB — GROUP B STREP BY PCR: Group B strep by PCR: NEGATIVE

## 2016-02-12 LAB — RPR: RPR: NONREACTIVE

## 2016-02-12 MED ORDER — DIBUCAINE 1 % RE OINT: 1.0000 "application " | TOPICAL_OINTMENT | RECTAL | Status: DC | PRN

## 2016-02-12 MED ORDER — FENTANYL CITRATE (PF) 100 MCG/2ML IJ SOLN
50.0000 ug | Freq: Once | INTRAMUSCULAR | Status: AC
  Administered 2016-02-12: 50 ug via INTRAVENOUS
  Filled 2016-02-12: qty 2

## 2016-02-12 MED ORDER — OXYTOCIN 40 UNITS IN LACTATED RINGERS INFUSION - SIMPLE MED
1.0000 m[IU]/min | INTRAVENOUS | Status: DC
  Administered 2016-02-12: 2 m[IU]/min via INTRAVENOUS

## 2016-02-12 MED ORDER — WITCH HAZEL-GLYCERIN EX PADS: 1.0000 "application " | MEDICATED_PAD | CUTANEOUS | Status: DC | PRN

## 2016-02-12 MED ORDER — ONDANSETRON HCL 4 MG PO TABS: 4.0000 mg | ORAL_TABLET | ORAL | Status: DC | PRN

## 2016-02-12 MED ORDER — PHENYLEPHRINE 40 MCG/ML (10ML) SYRINGE FOR IV PUSH (FOR BLOOD PRESSURE SUPPORT)
80.0000 ug | PREFILLED_SYRINGE | INTRAVENOUS | Status: DC | PRN
  Filled 2016-02-12: qty 5

## 2016-02-12 MED ORDER — DIPHENHYDRAMINE HCL 50 MG/ML IJ SOLN: 12.5000 mg | INTRAMUSCULAR | Status: DC | PRN

## 2016-02-12 MED ORDER — OXYCODONE-ACETAMINOPHEN 5-325 MG PO TABS
1.0000 | ORAL_TABLET | ORAL | Status: DC | PRN
  Administered 2016-02-13: 1 via ORAL
  Filled 2016-02-12: qty 1

## 2016-02-12 MED ORDER — FENTANYL 2.5 MCG/ML BUPIVACAINE 1/10 % EPIDURAL INFUSION (WH - ANES)
14.0000 mL/h | INTRAMUSCULAR | Status: DC | PRN
  Administered 2016-02-12 (×2): 14 mL/h via EPIDURAL
  Filled 2016-02-12 (×2): qty 125

## 2016-02-12 MED ORDER — LACTATED RINGERS IV SOLN
500.0000 mL | INTRAVENOUS | Status: DC | PRN
  Administered 2016-02-12: 500 mL via INTRAVENOUS

## 2016-02-12 MED ORDER — PHENYLEPHRINE 40 MCG/ML (10ML) SYRINGE FOR IV PUSH (FOR BLOOD PRESSURE SUPPORT)
80.0000 ug | PREFILLED_SYRINGE | INTRAVENOUS | Status: DC | PRN
  Filled 2016-02-12: qty 10
  Filled 2016-02-12: qty 5

## 2016-02-12 MED ORDER — EPHEDRINE 5 MG/ML INJ
10.0000 mg | INTRAVENOUS | Status: DC | PRN
  Filled 2016-02-12: qty 2

## 2016-02-12 MED ORDER — OXYTOCIN BOLUS FROM INFUSION
500.0000 mL | INTRAVENOUS | Status: DC
  Administered 2016-02-12: 500 mL via INTRAVENOUS

## 2016-02-12 MED ORDER — IBUPROFEN 600 MG PO TABS
600.0000 mg | ORAL_TABLET | Freq: Four times a day (QID) | ORAL | Status: DC
  Administered 2016-02-12 – 2016-02-14 (×7): 600 mg via ORAL
  Filled 2016-02-12 (×7): qty 1

## 2016-02-12 MED ORDER — ZOLPIDEM TARTRATE 5 MG PO TABS: 5.0000 mg | ORAL_TABLET | Freq: Every evening | ORAL | Status: DC | PRN

## 2016-02-12 MED ORDER — OXYCODONE-ACETAMINOPHEN 5-325 MG PO TABS
2.0000 | ORAL_TABLET | ORAL | Status: DC | PRN
  Administered 2016-02-13 – 2016-02-14 (×2): 2 via ORAL
  Filled 2016-02-12 (×2): qty 2

## 2016-02-12 MED ORDER — SENNOSIDES-DOCUSATE SODIUM 8.6-50 MG PO TABS
2.0000 | ORAL_TABLET | ORAL | Status: DC
  Administered 2016-02-13 (×2): 2 via ORAL
  Filled 2016-02-12 (×2): qty 2

## 2016-02-12 MED ORDER — LIDOCAINE HCL (PF) 1 % IJ SOLN
30.0000 mL | INTRAMUSCULAR | Status: DC | PRN
  Filled 2016-02-12: qty 30

## 2016-02-12 MED ORDER — SIMETHICONE 80 MG PO CHEW: 80.0000 mg | CHEWABLE_TABLET | ORAL | Status: DC | PRN

## 2016-02-12 MED ORDER — LACTATED RINGERS IV SOLN
INTRAVENOUS | Status: DC
  Administered 2016-02-12 (×3): via INTRAVENOUS

## 2016-02-12 MED ORDER — FLEET ENEMA 7-19 GM/118ML RE ENEM: 1.0000 | ENEMA | RECTAL | Status: DC | PRN

## 2016-02-12 MED ORDER — BENZOCAINE-MENTHOL 20-0.5 % EX AERO: 1.0000 "application " | INHALATION_SPRAY | CUTANEOUS | Status: DC | PRN

## 2016-02-12 MED ORDER — CEFTRIAXONE SODIUM 250 MG IJ SOLR
250.0000 mg | Freq: Once | INTRAMUSCULAR | Status: AC
  Administered 2016-02-12: 250 mg via INTRAMUSCULAR
  Filled 2016-02-12: qty 250

## 2016-02-12 MED ORDER — ONDANSETRON HCL 4 MG/2ML IJ SOLN
4.0000 mg | Freq: Four times a day (QID) | INTRAMUSCULAR | Status: DC | PRN
  Administered 2016-02-12: 4 mg via INTRAVENOUS
  Filled 2016-02-12: qty 2

## 2016-02-12 MED ORDER — FENTANYL CITRATE (PF) 100 MCG/2ML IJ SOLN
100.0000 ug | INTRAMUSCULAR | Status: DC | PRN
  Administered 2016-02-12: 100 ug via INTRAVENOUS
  Filled 2016-02-12: qty 2

## 2016-02-12 MED ORDER — ACETAMINOPHEN 325 MG PO TABS: 650.0000 mg | ORAL_TABLET | ORAL | Status: DC | PRN

## 2016-02-12 MED ORDER — ONDANSETRON HCL 4 MG/2ML IJ SOLN: 4.0000 mg | INTRAMUSCULAR | Status: DC | PRN

## 2016-02-12 MED ORDER — TETANUS-DIPHTH-ACELL PERTUSSIS 5-2.5-18.5 LF-MCG/0.5 IM SUSP
0.5000 mL | Freq: Once | INTRAMUSCULAR | Status: AC
  Administered 2016-02-14: 0.5 mL via INTRAMUSCULAR
  Filled 2016-02-12: qty 0.5

## 2016-02-12 MED ORDER — ACETAMINOPHEN 325 MG PO TABS
650.0000 mg | ORAL_TABLET | ORAL | Status: DC | PRN
  Administered 2016-02-13: 650 mg via ORAL
  Filled 2016-02-12: qty 2

## 2016-02-12 MED ORDER — LACTATED RINGERS IV SOLN
INTRAVENOUS | Status: DC
  Administered 2016-02-12 (×2): via INTRAVENOUS

## 2016-02-12 MED ORDER — TERBUTALINE SULFATE 1 MG/ML IJ SOLN
0.2500 mg | Freq: Once | INTRAMUSCULAR | Status: DC | PRN
  Filled 2016-02-12: qty 1

## 2016-02-12 MED ORDER — LACTATED RINGERS IV SOLN: 500.0000 mL | Freq: Once | INTRAVENOUS | Status: DC

## 2016-02-12 MED ORDER — OXYTOCIN 40 UNITS IN LACTATED RINGERS INFUSION - SIMPLE MED
2.5000 [IU]/h | INTRAVENOUS | Status: DC
  Filled 2016-02-12: qty 1000

## 2016-02-12 MED ORDER — COCONUT OIL OIL
1.0000 | TOPICAL_OIL | Status: DC | PRN
Start: 2016-02-12 — End: 2016-02-14

## 2016-02-12 MED ORDER — DIPHENHYDRAMINE HCL 25 MG PO CAPS: 25.0000 mg | ORAL_CAPSULE | Freq: Four times a day (QID) | ORAL | Status: DC | PRN

## 2016-02-12 MED ORDER — PRENATAL MULTIVITAMIN CH
1.0000 | ORAL_TABLET | Freq: Every day | ORAL | Status: DC
  Administered 2016-02-13 – 2016-02-14 (×2): 1 via ORAL
  Filled 2016-02-12 (×2): qty 1

## 2016-02-12 MED ORDER — SOD CITRATE-CITRIC ACID 500-334 MG/5ML PO SOLN: 30.0000 mL | ORAL | Status: DC | PRN

## 2016-02-12 MED ORDER — LACTATED RINGERS IV SOLN
500.0000 mL | Freq: Once | INTRAVENOUS | Status: AC
  Administered 2016-02-12: 500 mL via INTRAVENOUS

## 2016-02-12 MED ORDER — AZITHROMYCIN 1 G PO PACK
1.0000 g | PACK | Freq: Once | ORAL | Status: AC
  Administered 2016-02-12: 1 g via ORAL
  Filled 2016-02-12: qty 1

## 2016-02-12 MED ORDER — LIDOCAINE HCL (PF) 1 % IJ SOLN
INTRAMUSCULAR | Status: DC | PRN
  Administered 2016-02-12 (×2): 6 mL

## 2016-02-12 NOTE — Progress Notes (Signed)
Called back regarding pt in MAU. RN to check pt and CNM to call back regarding who will see the pt

## 2016-02-12 NOTE — MAU Note (Signed)
Pt presents complaining of contractions every 2 minutes since 3am. Denies leaking or bleeding. Reports good fetal movement. States she hasn't gotten care since January or February because of medicaid issues.

## 2016-02-12 NOTE — Progress Notes (Signed)
Message left requesting call back regarding pt arrival in MAU

## 2016-02-12 NOTE — Progress Notes (Signed)
Notified of pt arrival in MAU and exam. Will watch and recheck pt in 1 hour

## 2016-02-12 NOTE — Anesthesia Pain Management Evaluation Note (Signed)
  CRNA Pain Management Visit Note  Patient: Shelby Olsen, 20 y.o., female  "Hello I am a member of the anesthesia team at [facility]. We have an anesthesia team available at all times to provide care throughout the hospital, including epidural management and anesthesia for C-section. I don't know your plan for the delivery whether it a natural birth, water birth, IV sedation, nitrous supplementation, doula or epidural, but we want to meet your pain goals."   1.Was your pain managed to your expectations on prior hospitalizations?   Yes   2.What is your expectation for pain management during this hospitalization?     Epidural  3.How can we help you reach that goal? Epidural.  Record the patient's initial score and the patient's pain goal.   Pain: 4--back pain, per patient  Pain Goal: 6 [facility] wants you to be able to say your pain was always managed very well.  Antwane Grose L 02/12/2016

## 2016-02-12 NOTE — H&P (Signed)
Shelby Olsen is a 20 y.o. female [id number] @ 39.5 by dates presenting for contractions that started in the middle of the night.Pt has not had PNC. Was seen in April by [facility] cultures done, results pos for chla but pt could not be contacted and was not treated. Pt  And partner informed of this. Maternal Medical History:  Reason for admission: Contractions.   Contractions: Onset was 3-5 hours ago.   Frequency: regular.    Fetal activity: Perceived fetal activity is normal.   Last perceived fetal movement was within the past hour.    Prenatal complications: No prenatal care. Pos chla in April with no treatment  Prenatal Complications - Diabetes: none.    OB History    Gravida Para Term Preterm AB TAB SAB Ectopic Multiple Living   4 3 3  0 0 0 0 0 0 2     Past Medical History  Diagnosis Date  . Migraine   . Hx of measles    Past Surgical History  Procedure Laterality Date  . No past surgeries     Family History: family history includes Asthma in her brother and sister; Hypertension in her mother. Social History:  reports that she has never smoked. She has never used smokeless tobacco. She reports that she does not drink alcohol or use illicit drugs.   Prenatal Transfer Tool  Maternal Diabetes: No Genetic Screening: Normal Maternal Ultrasounds/Referrals: Normal Fetal Ultrasounds or other Referrals:  None Maternal Substance Abuse:  No Significant Maternal Medications:  None Significant Maternal Lab Results:  None Other Comments:  no prenatal care. was pos for trich in april but could not find pt for treatment  Review of Systems  Constitutional: Negative.   HENT: Negative.   Eyes: Negative.   Respiratory: Negative.   Cardiovascular: Negative.   Gastrointestinal: Positive for abdominal pain.  Genitourinary: Negative.   Musculoskeletal: Negative.   Skin: Negative.   Neurological: Negative.   Endo/Heme/Allergies: Negative.   Psychiatric/Behavioral: Negative.      Dilation: 2 Effacement (%): 60 Station: -1 Exam by:: Sharen Hintaroline Brewer RNC Blood pressure 136/85, pulse 106, temperature 98.3 F (36.8 C), temperature source Oral, resp. rate 18, currently breastfeeding. Maternal Exam:  Uterine Assessment: Contraction strength is mild.  Contraction frequency is regular.   Abdomen: Patient reports no abdominal tenderness. Fetal presentation: vertex  Introitus: Normal vulva. Normal vagina.  Ferning test: not done.  Nitrazine test: not done.  Pelvis: adequate for delivery.   Cervix: Cervix evaluated by digital exam.     Fetal Exam Fetal Monitor Review: Mode: ultrasound.   Variability: moderate (6-25 bpm).   Pattern: variable decelerations.    Fetal State Assessment: Category II - tracings are indeterminate.     Physical Exam  Constitutional: She is oriented to person, place, and time. She appears well-developed and well-nourished.  HENT:  Head: Normocephalic.  Eyes: Pupils are equal, round, and reactive to light.  Neck: Normal range of motion.  Cardiovascular: Normal rate, regular rhythm, normal heart sounds and intact distal pulses.   Respiratory: Effort normal and breath sounds normal.  GI: Soft. Bowel sounds are normal.  Genitourinary: Vagina normal.  Musculoskeletal: Normal range of motion.  Neurological: She is alert and oriented to person, place, and time. She has normal reflexes.  Skin: Skin is warm and dry.  Psychiatric: She has a normal mood and affect. Her behavior is normal. Judgment and thought content normal.    Prenatal labs: ABO, Rh: --/--/A POS (12/19 1911) Antibody:   Rubella:  RPR:    HBsAg:    HIV: Non Reactive (12/19 1930)  GBS:     Assessment/Plan: SVE 3/70/-2to -3. No prenatal care. Pos chla in April with no tx. Variable decles. PNN labs, wet prep, cultures, IV hydration. Consult attending and probable admit.   Wyvonnia Dusky 02/12/2016, 7:42 AM

## 2016-02-12 NOTE — Progress Notes (Signed)
Labor Progress Note Shelby Olsen is a 20 y.o. [id number] at 7071w5d presented for early labor   S:  Feeling ctx, little improvement after IV Fentanyl.  O:  BP 132/88 mmHg  Pulse 91  Temp(Src) 98.8 F (37.1 C) (Oral)  Resp 20  Ht 5\' 2"  (1.575 m)  Wt 178 lb (80.74 kg)  BMI 32.55 kg/m2 EFM: baseline 135 bpm/mod variability/no accels/ occ variable decels  Toco: 2-5 SVE: Dilation: 3.5 Effacement (%): 70 Cervical Position: Middle Station: -3 Presentation: Vertex Exam by:: Anirudh Baiz  A/P: 19 y.o. [id number] 4571w5d  1. Labor: early active 2. FWB: Cat II 3. Pain: IV, epidural per request 4. GBS negative Recheck in 2-4 hrs, augment if no change. Anticipate SVD.  Donette LarryMelanie Timm Bonenberger, CNM 12:45 PM

## 2016-02-12 NOTE — Progress Notes (Signed)
Notified of pt FHR tracing remaining non reactive despite intervention and having late decelerations. Informed resident of pt's no prenatal care and hx of term demise. Will continue to monitor strip and wait for call back from resident

## 2016-02-12 NOTE — MAU Note (Signed)
Per Stall,CNM pt did not establish care with their practice during the pregnancy and she will be a Faculty pt.

## 2016-02-12 NOTE — Progress Notes (Addendum)
Shelby Olsen is a 20 y.o. [phone] at 4410w5d by LMP admitted for induction of labor due to h/o IUFD at term.  Subjective: Feeling well. S/p epidural. On pitocin with coupling contraction pattern  Objective: BP 122/71 mmHg  Pulse 115  Temp(Src) 99 F (37.2 C) (Oral)  Resp 18  Ht 5\' 2"  (1.575 m)  Wt 178 lb (80.74 kg)  BMI 32.55 kg/m2  SpO2 99%      FHT:  FHR: 150 bpm, variability: moderate,  accelerations:  Present,  decelerations:  Absent UC:   irregular, every 5 minutes with coupling SVE:   Dilation: 5 Effacement (%): 70 Station: -2 Exam by:: Shelby Olsen  BSUS performed - confirmed vertex AROM with copious clear fluid, fetal hand over head-- gently pushed back.   Labs: Lab Results  Component Value Date   WBC 10.1 02/12/2016   HGB 12.6 02/12/2016   HCT 38.0 02/12/2016   MCV 82.3 02/12/2016   PLT 244 02/12/2016    Assessment / Plan: Induction of labor due to h/o term IUFD,  progressing well on pitocin  Labor: Progressing normally Preeclampsia:  na Fetal Wellbeing:  Category I Pain Control:  Epidural I/D:  GBS  NEG Anticipated MOD:  NSVD  Shelby FlakeKimberly Olsen Shelby Olsen 02/12/2016, 6:43 PM

## 2016-02-12 NOTE — Progress Notes (Signed)
Labor Progress Note Shelby Olsen is a 20 y.o. [id number] at 9730w5d admitted for FHT S: no complaint. Feels contractions. Pain controlled with epidural  O:  BP 124/83 mmHg  Pulse 99  Temp(Src) 98.6 F (37 C) (Oral)  Resp 16  Ht 5\' 2"  (1.575 m)  Wt 178 lb (80.74 kg)  BMI 32.55 kg/m2  SpO2 99% EFM: 140/mod var/+accels/no decels  CVE: Dilation: 4 Effacement (%): 70 Cervical Position: Middle Station: -2 Presentation: Vertex Exam by:: sowder   A&P: 20 y.o. [id number] 2530w5d admitted for IOL for FHT #Labor: pitocin #Pain: epidural #FWB: CAT-1 #GBS: Neg  Almon Herculesaye T Haylea Schlichting, MD 5:24 PM

## 2016-02-12 NOTE — Anesthesia Procedure Notes (Signed)
Epidural Patient location during procedure: OB  Staffing Anesthesiologist: Sherrian DiversENENNY, Ndrew Creason  Preanesthetic Checklist Completed: patient identified, site marked, surgical consent, pre-op evaluation, timeout performed, IV checked, risks and benefits discussed and monitors and equipment checked  Epidural Patient position: sitting Prep: DuraPrep Patient monitoring: blood pressure and heart rate Approach: midline Location: L4-L5 Injection technique: LOR saline  Needle:  Needle type: Tuohy  Needle gauge: 17 G Needle length: 9 cm Needle insertion depth: 7 cm Catheter type: closed end flexible Catheter size: 19 Gauge Catheter at skin depth: 13 cm Test dose: negative and Other  Assessment Events: blood not aspirated, injection not painful, no injection resistance, negative IV test and no paresthesia  Additional Notes Reason for block:procedure for pain

## 2016-02-12 NOTE — Anesthesia Preprocedure Evaluation (Signed)
Anesthesia Evaluation  Patient identified by MRN, date of birth, ID band Patient awake    Reviewed: Allergy & Precautions, NPO status , Patient's Chart, lab work & pertinent test results  Airway Mallampati: II  TM Distance: >3 FB Neck ROM: Full    Dental no notable dental hx.    Pulmonary neg pulmonary ROS,    Pulmonary exam normal breath sounds clear to auscultation       Cardiovascular negative cardio ROS Normal cardiovascular exam Rhythm:Regular Rate:Normal     Neuro/Psych  Headaches, negative psych ROS   GI/Hepatic negative GI ROS, Neg liver ROS,   Endo/Other  negative endocrine ROS  Renal/GU negative Renal ROS  negative genitourinary   Musculoskeletal negative musculoskeletal ROS (+)   Abdominal   Peds negative pediatric ROS (+)  Hematology negative hematology ROS (+)   Anesthesia Other Findings   Reproductive/Obstetrics negative OB ROS                             Anesthesia Physical Anesthesia Plan  ASA: II  Anesthesia Plan: Epidural   Post-op Pain Management:    Induction: Intravenous  Airway Management Planned: Natural Airway  Additional Equipment:   Intra-op Plan:   Post-operative Plan:   Informed Consent: I have reviewed the patients History and Physical, chart, labs and discussed the procedure including the risks, benefits and alternatives for the proposed anesthesia with the patient or authorized representative who has indicated his/her understanding and acceptance.   Dental advisory given  Plan Discussed with: CRNA  Anesthesia Plan Comments: (Informed consent obtained prior to proceeding including risk of failure, 1% risk of PDPH, risk of minor discomfort and bruising.  Discussed rare but serious complications including epidural abscess, permanent nerve injury, epidural hematoma.  Discussed alternatives to epidural analgesia and patient desires to proceed.   Timeout performed pre-procedure verifying patient name, procedure, and platelet count.  Patient tolerated procedure well.)        Anesthesia Quick Evaluation  

## 2016-02-13 LAB — HEPATITIS B SURFACE ANTIGEN: HEP B S AG: NEGATIVE

## 2016-02-13 LAB — GC/CHLAMYDIA PROBE AMP (~~LOC~~) NOT AT ARMC
Chlamydia: POSITIVE — AB
Neisseria Gonorrhea: NEGATIVE

## 2016-02-13 LAB — RUBELLA SCREEN: Rubella: 4.67 index (ref >0.99)

## 2016-02-13 MED ORDER — MEASLES, MUMPS & RUBELLA VAC ~~LOC~~ INJ
0.5000 mL | INJECTION | Freq: Once | SUBCUTANEOUS | Status: DC
  Filled 2016-02-13: qty 0.5

## 2016-02-13 NOTE — Anesthesia Postprocedure Evaluation (Signed)
Anesthesia Post Note  Patient: Shelby Olsen  Procedure(s) Performed: * No procedures listed *  Patient location during evaluation: Mother Baby Anesthesia Type: Epidural Level of consciousness: awake Pain management: pain level controlled Vital Signs Assessment: post-procedure vital signs reviewed and stable Respiratory status: spontaneous breathing Cardiovascular status: stable Postop Assessment: no headache, no backache, epidural receding and patient able to bend at knees Anesthetic complications: no     Last Vitals:  Filed Vitals:   02/13/16 0542 02/13/16 0937  BP: 109/64 117/63  Pulse: 104 100  Temp: 36.7 C 36.8 C  Resp: 18 17    Last Pain:  Filed Vitals:   02/13/16 0940  PainSc: 9    Pain Goal:                 Edison PaceWILKERSON,Gianlucas Evenson

## 2016-02-13 NOTE — Progress Notes (Signed)
Post Partum Day 1 Subjective: no complaints, up ad lib, voiding, tolerating PO and + flatus  Objective: Blood pressure 109/64, pulse 104, temperature 98 F (36.7 C), temperature source Oral, resp. rate 18, height 5\' 2"  (1.575 m), weight 178 lb (80.74 kg), SpO2 99 %, unknown if currently breastfeeding.  Physical Exam:  General: alert, cooperative, moderate distress and pale Lochia: appropriate Uterine Fundus: firm Incision: NA DVT Evaluation: No evidence of DVT seen on physical exam.   Recent Labs  02/12/16 0737  HGB 12.6  HCT 38.0    Assessment/Plan: Plan for discharge tomorrow   LOS: 1 day   Edris Friedt 02/13/2016, 9:10 AM

## 2016-02-13 NOTE — Clinical Social Work Maternal (Signed)
CLINICAL SOCIAL WORK MATERNAL/CHILD NOTE  Patient Details  Name: Shelby Olsen MRN: [id number] Date of Birth: 03/13/1996  Date:  2015-12-16  Clinical Social Worker Initiating Note:  Laurey Arrow Date/ Time Initiated:  2016/01/05/1430     Child's Name:  Shelby Olsen   Legal Guardian:  Mother   Need for Interpreter:  None   Date of Referral:  2016/06/09     Reason for Referral:  Late or No Prenatal Care    Referral Source:  [facility]   Address:  [address] [place] [address]  Phone number:  [phone]   Household Members:  Self, Minor Children, Significant Other   Natural Supports (not living in the home):  Extended Family, Immediate Family, Spouse/significant other   Professional Supports: None (information provided to MOB for [employer] program)   Employment: Unemployed   Type of Work:     Education:  Database administrator Resources:  [employer]   Other Resources:  [employer], Physicist, medical    Cultural/Religious Considerations Which May Impact Care:  none report  Strengths:  Ability to meet basic needs , Home prepared for child , Pediatrician chosen    Risk Factors/Current Problems:  Other (Comment) (No PNC)   Cognitive State:  Alert , Linear Thinking    Mood/Affect:  Comfortable , Calm , Interested , Relaxed    CSW Assessment: CSW met with MOB for to complete an assessment for a consult for no PNC.  MOB was inviting, polite, and engaging.  MOB introduced her room guest to CSW as FOB (Cedric Manetta).  MOB gave CSW permission to speak with her in the presence of FOB. CSW inquired about the family's support and the home preparation for the new infant.  MOB stated that she receives supports for FOB, FOB's family, and from MOB's family.  MOB also communicated that she feels prepared to care for new infant, and has the necessities for him (bassinet, cars eat, diaper bag etc..).  CSW reviewed hospital's drug screen policy with MOB,  and encouraged MOB to ask questions.  MOB was understanding and denied utilizing substances during her pregnancy. CSW inquired about MOB's lack of prenatal care during pregnancy; MOB stated that she was undecided rather or not she wanted to continue with the pregnancy when she found out she was pregnant. MOB communicated to CSW that after realizing that she was too far along for an abortion, she did seek prenatal care.  MOB disclosed that she went to one appointment at [facility], and was informed that she did not have Medicaid and will need to pay $2,000 up front for prenatal care.  MOB stated that paying that amount of money was unrealistic for her family.  MOB re-applied for Medicaid and was approved, however, she that after Medicaid approval she was near her due, and did not follow up with medical assistance. CSW thanked MOB for being honest, and informed MOB that the infant had a negative UDS. MOB was again understanding of the hospitals policy and procedures and did not have any questions or concerns. CSW offered resources and referrals for community parenting education and MOB declined.   CSW educated MOB about PPD and informed MOB of possible supports and interventions to decrease PPD.  CSW also encouraged MOB to seek medical attention if needed for increased signs and symptoms for PPD. CSW reviewed safe sleep, and SIDS. MOB was knowledgeable, and expressed that she remembers information for her past two pregnancies.  MOB and did  not have any questions or concerns at this time, and CSW thanked both parents for allowing CSW to meet with them. CSW Plan/Description:  Patient/Family Education , No Further Intervention Required/No Barriers to Discharge    Dimple Nanas, LCSW 02/13/2016, 2:34 PM

## 2016-02-13 NOTE — Lactation Note (Signed)
This note was copied from a baby's chart. Lactation Consultation Note  Patient Name: Shelby Olsen ZOXWR'UToday's Date: 02/13/2016 Reason for consult: Initial assessment   Initial consult with Exp BF mom of 16 hour infant. Infant with 5 bottle feeds of formula of 5-20 cc, 1 BF attempt, 3 voids and 4 stool since birth. Infant weight 6 lb 9.5 oz.   Mom reports she BF her two older boys for 22 months and 6 months. She has a DEBP set up in the room and reports she is pumping every 3 hours followed by hand expression. She reports she is not obtaining colostrum yet and that her milk usually comes in after she gets home. She reports she is attempting to latch/latching infant to breast prior to bottle feeding and then pumping and hand expressing.  [facility] and is aware to call and make f/u appt after d/c. She does not have a pump at home. WIC loaner pump program reviewed with mom.   BF Resources Handout and LC Brochure given, informed mom of IP/OP Services, BF Support Groups and LC Phone #.   Mom declined assistance at this time. She denies questions/concerns at this time. Follow up tomorrow and prn.   Maternal Data Formula Feeding for Exclusion: Yes Reason for exclusion: Mother's choice to formula and breast feed on admission Has patient been taught Hand Expression?: Yes (per mom) Does the patient have breastfeeding experience prior to this delivery?: Yes  Feeding Feeding Type: Bottle Fed - Formula Nipple Type: Slow - flow  LATCH Score/Interventions                      Lactation Tools Discussed/Used WIC Program: Yes Pump Review: Setup, frequency, and cleaning Initiated by:: REviewed   Consult Status Consult Status: Follow-up Date: 02/14/16 Follow-up type: In-patient    Shelby Olsen 02/13/2016, 12:34 PM

## 2016-02-14 ENCOUNTER — Encounter (HOSPITAL_COMMUNITY): Payer: Self-pay | Admitting: Student

## 2016-02-14 LAB — CULTURE, OB URINE

## 2016-02-14 MED ORDER — IBUPROFEN 600 MG PO TABS: 600.0000 mg | ORAL_TABLET | Freq: Four times a day (QID) | ORAL | Status: DC

## 2016-02-14 MED ORDER — ACETAMINOPHEN 325 MG PO TABS: 650.0000 mg | ORAL_TABLET | ORAL | Status: DC | PRN

## 2016-02-14 NOTE — Progress Notes (Signed)
Pt given discharge orders and verbalized understanding. Patient [facility] @1140 .

## 2016-02-14 NOTE — Discharge Instructions (Addendum)
Vaginal Delivery, Care After °Refer to this sheet in the next few weeks. These discharge instructions provide you with information on caring for yourself after delivery. Your health care provider may also give you specific instructions. Your treatment has been planned according to the most current medical practices available, but problems sometimes occur. Call your health care provider if you have any problems or questions after you go home. °HOME CARE INSTRUCTIONS °· Take over-the-counter or prescription medicines only as directed by your health care provider or pharmacist. °· Do not drink alcohol, especially if you are breastfeeding or taking medicine to relieve pain. °· Do not chew or smoke tobacco. °· Do not use illegal drugs. °· Continue to use good perineal care. Good perineal care includes: °¨ Wiping your perineum from front to back. °¨ Keeping your perineum clean. °· Do not use tampons or douche until your health care provider says it is okay. °· Shower, wash your hair, and take tub baths as directed by your health care provider. °· Wear a well-fitting bra that provides breast support. °· Eat healthy foods. °· Drink enough fluids to keep your urine clear or pale yellow. °· Eat high-fiber foods such as whole grain cereals and breads, brown rice, beans, and fresh fruits and vegetables every day. These foods may help prevent or relieve constipation. °· Follow your health care provider's recommendations regarding resumption of activities such as climbing stairs, driving, lifting, exercising, or traveling. °· Talk to your health care provider about resuming sexual activities. Resumption of sexual activities is dependent upon your risk of infection, your rate of healing, and your comfort and desire to resume sexual activity. °· Try to have someone help you with your household activities and your newborn for at least a few days after you leave the hospital. °· Rest as much as possible. Try to rest or take a nap  when your newborn is sleeping. °· Increase your activities gradually. °· Keep all of your scheduled postpartum appointments. It is very important to keep your scheduled follow-up appointments. At these appointments, your health care provider will be checking to make sure that you are healing physically and emotionally. °SEEK MEDICAL CARE IF:  °· You are passing large clots from your vagina. Save any clots to show your health care provider. °· You have a foul smelling discharge from your vagina. °· You have trouble urinating. °· You are urinating frequently. °· You have pain when you urinate. °· You have a change in your bowel movements. °· You have increasing redness, pain, or swelling near your vaginal incision (episiotomy) or vaginal tear. °· You have pus draining from your episiotomy or vaginal tear. °· Your episiotomy or vaginal tear is separating. °· You have painful, hard, or reddened breasts. °· You have a severe headache. °· You have blurred vision or see spots. °· You feel sad or depressed. °· You have thoughts of hurting yourself or your newborn. °· You have questions about your care, the care of your newborn, or medicines. °· You are dizzy or light-headed. °· You have a rash. °· You have nausea or vomiting. °· You were breastfeeding and have not had a menstrual period within 12 weeks after you stopped breastfeeding. °· You are not breastfeeding and have not had a menstrual period by the 12th week after delivery. °· You have a fever. °SEEK IMMEDIATE MEDICAL CARE IF:  °· You have persistent pain. °· You have chest pain. °· You have shortness of breath. °· You faint. °· You   have leg pain.  You have stomach pain.  Your vaginal bleeding saturates two or more sanitary pads in 1 hour.   This information is not intended to replace advice given to you by your health care provider. Make sure you discuss any questions you have with your health care provider.   Document Released: 08/09/2000 Document Revised:  05/03/2015 Document Reviewed: 04/08/2012 Elsevier Interactive Patient Education 2016 [employer]  Expedited Partner Therapy:  Information Sheet for Patients and Partners               You have been offered expedited partner therapy (EPT). This information sheet contains important information and warnings you need to be aware of, so please read it carefully.   Expedited Partner Therapy (EPT) is the clinical practice of treating the sexual partners of persons who receive chlamydia, gonorrhea, or trichomoniasis diagnoses by providing medications or prescriptions to the patient. Patients then provide partners with these therapies without the health-care provider having examined the partner. In other words, EPT is a convenient, fast and private way for patients to help their sexual partners get treated.   Chlamydia and gonorrhea are bacterial infections you get from having sex with a person who is already infected. Trichomoniasis (or trich) is a very common sexually transmitted infection (STI) that is caused by infection with a protozoan parasite called Trichomonas vaginalis.  Many people with these infections dont know it because they feel fine, but without treatment these infections can cause serious health problems, such as pelvic inflammatory disease, ectopic pregnancy, infertility and increased risk of HIV.   It is important to get treated as soon as possible to protect your health, to avoid spreading these infections to others, and to prevent yourself from becoming re-infected. The good news is these infections can be easily cured with proper antibiotic medicine. The best way to take care of your self is to see a doctor or go to your local health department. If you are not able to see a doctor or other medical provider, you should take EPT.    Recommended Medication: EPT for Chlamydia:  Azithromycin (Zithromax) 1 gram orally in a single dose EPT for Gonorrhea:  Cefixime (Suprax) 400  milligrams orally in a single dose PLUS azithromycin (Zithromax) 1 gram orally in a single dose EPT for Trichomoniasis:  Metronidazole (Flagyl) 2 grams orally in a single dose   These medicines are very safe. However, you should not take them if you have ever had an allergic reaction (like a rash) to any of these medicines: azithromycin (Zithromax), erythromycin, clarithromycin (Biaxin), metronidazole (Flagyl), tinidazole (Tindimax). If you are uncertain about whether you have an allergy, call your medical provider or pharmacist before taking this medicine. If you have a serious, long-term illness like kidney, liver or heart disease, colitis or stomach problems, or you are currently taking other prescription medication, talk to your provider before taking this medication.   Women: If you have lower belly pain, pain during sex, vomiting, or a fever, do not take this medicine. Instead, you should see a medical provider to be certain you do not have pelvic inflammatory disease (PID). PID can be serious and lead to infertility, pregnancy problems or chronic pelvic pain.   Pregnant Women: It is very important for you to see a doctor to get pregnancy services and pre-natal care. These antibiotics for EPT are safe for pregnant women, but you still need to see a medical provider as soon as possible. It is also important to note that  Doxycycline is an alternative therapy for chlamydia, but it should not be taken by someone who is pregnant.   Men: If you have pain or swelling in the testicles or a fever, do not take this medicine and see a medical provider.     Men who have sex with men (MSM): MSM in [place] continue to experience high rates of syphilis and HIV. Many MSM with gonorrhea or chlamydia could also have syphilis and/or HIV and not know it. If you are a man who has sex with other men, it is very important that you see a medical provider and are tested for HIV and syphilis. EPT is not recommended  for gonorrhea for MSM.  Recommended treatment for gonorrhea for MSM is Rocephin (shot) AND azithromycin due to decreased cure rate.  Please see your medical provider if this is the case.    Along with this information sheet is a prescription for the medicine. If you receive a prescription it will be in your name and will indicate your date of birth, or it will be in the name of Expedited Partner Therapy.   In either case, you can have the prescription filled at a pharmacy. You will be responsible for the cost of the medicine, unless you have prescription drug coverage. In that case, you could provide your name so the pharmacy could bill your health plan.   Take the medication as directed. Some people will have a mild, upset stomach, which does not last long. AVOID alcohol 24 hours after taking metronidazole (Flagyl) to reduce the possibility of a disulfiram-like reaction (severe vomiting and abdominal pain).  After taking the medicine, do not have sex for 7 days. Do not share this medicine or give it to anyone else. It is important to tell everyone you have had sex with in the last 60 days that they need to go and get tested for sexually transmitted infections.   Ways to prevent these and other sexually transmitted infections (STIs):    Abstain from sex. This is the only sure way to avoid getting an STI.   Use barrier methods, such as condoms, consistently and correctly.   Limit the number of sexual partners.   Have regular physical exams, including testing for STIs.   For more information about EPT or other issues pertaining to an STI, please contact your medical provider or the [facility] Department at [phone] or [email or web address]

## 2016-02-14 NOTE — Progress Notes (Signed)
Patient was treated for chlamydia. She was given paper prescription for Azithromycin for partner treatment upon discharge.

## 2016-02-14 NOTE — Lactation Note (Signed)
This note was copied from a baby's chart. Lactation Consultation Note  Follow up visit prior to discharge.  Mom is giving formula bottles and pumping occasionally.  She states she wants to wait until her milk comes in at home to breastfeed.  She states this is what she did with her last baby.  Lactation outpatient services and support information reviewed and encouraged.  Patient Name: Boy Avonelle Hegstrom ZVikki PortsOXWR'UToday's Date: 02/14/2016     Maternal Data    Feeding Feeding Type: Bottle Fed - Formula Nipple Type: Slow - flow  LATCH Score/Interventions                      Lactation Tools Discussed/Used     Consult Status      Huston FoleyMOULDEN, Alleene Stoy S 02/14/2016, 9:48 AM

## 2016-02-14 NOTE — Discharge Summary (Signed)
OB Discharge Summary     Patient Name: Shelby Olsen DOB: 25-Jan-1996 MRN: [id number]  Date of admission: 02/12/2016 Delivering MD: Lyndel Safe NILES   Date of discharge: 02/14/2016  Admitting diagnosis: 39 WEEKS CTX Intrauterine pregnancy: [redacted]w[redacted]d     Secondary diagnosis:  Active Problems:   IUFD (intrauterine fetal death) - in previous pregnancy   Late prenatal care   Active labor   Chlamydia infection affecting pregnancy   NSVD (normal spontaneous vaginal delivery)  Additional problems: none     Discharge diagnosis: Term Pregnancy Delivered                                                                                                Post partum procedures:none  Augmentation: Pitocin  Complications: None  Hospital course:  Onset of Labor With Vaginal Delivery     20 y.o. yo [phone] at [redacted]w[redacted]d was admitted in Active Labor on 02/12/2016. Patient had an uncomplicated labor course as follows:  Membrane Rupture Time/Date: 6:12 PM ,02/12/2016   Intrapartum Procedures: Episiotomy: None [1]                                         Lacerations:  None [1]  Patient had a delivery of a Viable infant. 02/12/2016  Information for the patient's newborn:  Keilin, [place] [[id number]]  Delivery Method: Vaginal, Spontaneous Delivery (Filed from Delivery Summary)    Pateint had an uncomplicated postpartum course.  She is ambulating, tolerating a regular diet, passing flatus, and urinating well. Patient is discharged home in stable condition on 02/14/2016.  Chlamydia: tested positive in April 2017. Was not treated at that time. Received Azithromycin 1 gm PO and CTX 250 mg IM here.   ?UTI: UA with few bacteria, +LE and nitrite. Urine culture pending at time of discharge. She has no clinical signs and symptoms of UTI. She has also received CTX 250 mg once.   Physical exam  Filed Vitals:   02/13/16 0542 02/13/16 0937 02/14/16 0653 02/14/16 0757  BP: 109/64 117/63 101/58 111/76   Pulse: 104 100 75 72  Temp: 98 F (36.7 C) 98.3 F (36.8 C) 97.8 F (36.6 C) 97.8 F (36.6 C)  TempSrc: Oral Oral Oral Oral  Resp: Height:      Weight:      SpO2:    100%   General: alert, cooperative and no distress Lochia: appropriate Uterine Fundus: firm Incision: N/A DVT Evaluation: No evidence of DVT seen on physical exam. Labs: Lab Results  Component Value Date   WBC 10.1 02/12/2016   HGB 12.6 02/12/2016   HCT 38.0 02/12/2016   MCV 82.3 02/12/2016   PLT 244 02/12/2016   CMP Latest Ref Rng 08/14/2015  Glucose 65 - 99 mg/dL 80  BUN 6 - 20 mg/dL 9  Creatinine 1.30 - 8.65 mg/dL 7.84  Sodium 696 - 295 mmol/L 136  Potassium 3.5 - 5.1 mmol/L 3.7  Chloride 101 - 111 mmol/L 106  CO2  22 - 32 mmol/L 18(L)  Calcium 8.9 - 10.3 mg/dL 9.6  Total Protein 6.5 - 8.1 g/dL 7.5  Total Bilirubin 0.3 - 1.2 mg/dL 4.0(J0.2(L)  Alkaline Phos 38 - 126 U/L 70  AST 15 - 41 U/L 15  ALT 14 - 54 U/L 17    Discharge instruction: per After Visit Summary and "Baby and Me Booklet".  After visit meds:    Medication List    TAKE these medications        acetaminophen 325 MG tablet  Commonly known as:  TYLENOL  Take 2 tablets (650 mg total) by mouth every 4 (four) hours as needed (for pain scale < 4).     ibuprofen 600 MG tablet  Commonly known as:  ADVIL,MOTRIN  Take 1 tablet (600 mg total) by mouth every 6 (six) hours.     prenatal multivitamin Tabs tablet  Take 1 tablet by mouth every morning.        Diet: routine diet  Activity: Advance as tolerated. Pelvic rest for 6 weeks.   Outpatient follow up:6 weeks Follow-up Information    Follow up with [facility] In 6 weeks.   Specialty:  Obstetrics and Gynecology   Why:  Postpartum check   Contact information:   [address] [place] [id number] [phone]      Postpartum contraception: IUD Mirena  Newborn Data: Live born female  Birth Weight: 6 lb 9.5 oz (2990 g) APGAR: 6,  6  Baby Feeding: Bottle and Breast Disposition:home with mother   02/14/2016 Almon Herculesaye T Gonfa, MD   OB FELLOW DISCHARGE ATTESTATION  I have seen and examined this patient and agree with above documentation in the resident's note.   Silvano BilisNoah B Cornel Werber, MD 9:06 AM

## 2016-03-25 ENCOUNTER — Ambulatory Visit: Payer: Medicaid Other | Admitting: Obstetrics and Gynecology

## 2016-03-25 ENCOUNTER — Encounter: Payer: Self-pay | Admitting: Obstetrics and Gynecology

## 2016-03-25 NOTE — Progress Notes (Signed)
Patient did not keep routine postpartum visit on 03/25/2016 at 1340  [place] Bing, Montez Hageman MD Attending Center for [employer] [facility])

## 2016-04-10 ENCOUNTER — Encounter: Payer: Self-pay | Admitting: *Deleted

## 2016-05-22 ENCOUNTER — Ambulatory Visit: Payer: Medicaid Other | Admitting: Advanced Practice Midwife

## 2016-08-21 ENCOUNTER — Ambulatory Visit: Payer: Medicaid Other | Admitting: Family

## 2016-12-31 ENCOUNTER — Inpatient Hospital Stay (HOSPITAL_COMMUNITY)
Admission: AD | Admit: 2016-12-31 | Discharge: 2016-12-31 | Disposition: A | Discharge status: Home / Self Care | Payer: Medicaid Other | Source: Ambulatory Visit | Attending: Family Medicine | Admitting: Family Medicine

## 2016-12-31 ENCOUNTER — Encounter (HOSPITAL_COMMUNITY): Payer: Self-pay | Admitting: *Deleted

## 2016-12-31 DIAGNOSIS — Z825 Family history of asthma and other chronic lower respiratory diseases: Secondary | ICD-10-CM | POA: Insufficient documentation

## 2016-12-31 DIAGNOSIS — O26891 Other specified pregnancy related conditions, first trimester: Secondary | ICD-10-CM | POA: Diagnosis not present

## 2016-12-31 DIAGNOSIS — R1030 Lower abdominal pain, unspecified: Secondary | ICD-10-CM | POA: Insufficient documentation

## 2016-12-31 DIAGNOSIS — Z3A01 Less than 8 weeks gestation of pregnancy: Secondary | ICD-10-CM | POA: Insufficient documentation

## 2016-12-31 DIAGNOSIS — Z91010 Allergy to peanuts: Secondary | ICD-10-CM | POA: Insufficient documentation

## 2016-12-31 DIAGNOSIS — Z8249 Family history of ischemic heart disease and other diseases of the circulatory system: Secondary | ICD-10-CM | POA: Diagnosis not present

## 2016-12-31 DIAGNOSIS — O219 Vomiting of pregnancy, unspecified: Secondary | ICD-10-CM

## 2016-12-31 DIAGNOSIS — R109 Unspecified abdominal pain: Secondary | ICD-10-CM | POA: Diagnosis present

## 2016-12-31 DIAGNOSIS — O26899 Other specified pregnancy related conditions, unspecified trimester: Secondary | ICD-10-CM

## 2016-12-31 LAB — CBC
HCT: 41.4 % (ref 36.0–46.0)
Hemoglobin: 14.1 g/dL (ref 12.0–15.0)
MCH: 28.2 pg (ref 26.0–34.0)
MCHC: 34.1 g/dL (ref 30.0–36.0)
MCV: 82.8 fL (ref 78.0–100.0)
PLATELETS: 286 10*3/uL (ref 150–400)
RBC: 5 MIL/uL (ref 3.87–5.11)
RDW: 12.8 % (ref 11.5–15.5)
WBC: 10 10*3/uL (ref 4.0–10.5)

## 2016-12-31 LAB — POCT PREGNANCY, URINE: PREG TEST UR: POSITIVE — AB

## 2016-12-31 LAB — URINALYSIS, ROUTINE W REFLEX MICROSCOPIC
BILIRUBIN URINE: NEGATIVE
Glucose, UA: NEGATIVE mg/dL
HGB URINE DIPSTICK: NEGATIVE
KETONES UR: NEGATIVE mg/dL
Leukocytes, UA: NEGATIVE
Nitrite: NEGATIVE
PROTEIN: NEGATIVE mg/dL
Specific Gravity, Urine: 1.032 — ABNORMAL HIGH (ref 1.005–1.030)
pH: 5 (ref 5.0–8.0)

## 2016-12-31 LAB — WET PREP, GENITAL
Sperm: NONE SEEN
Trich, Wet Prep: NONE SEEN
Yeast Wet Prep HPF POC: NONE SEEN

## 2016-12-31 LAB — HCG, QUANTITATIVE, PREGNANCY: HCG, BETA CHAIN, QUANT, S: 11688 m[IU]/mL — AB (ref <5)

## 2016-12-31 MED ORDER — PROMETHAZINE HCL 25 MG PO TABS: 25.0000 mg | ORAL_TABLET | Freq: Four times a day (QID) | ORAL | 2 refills | Status: DC | PRN

## 2016-12-31 NOTE — MAU Provider Note (Signed)
History     CSN: [id number]  Arrival date and time: 12/31/16 1527  First Provider Initiated Contact with Patient 12/31/16 1734      Chief Complaint  Patient presents with  . Abdominal Pain  . Emesis During Pregnancy   HPI Shelby Olsen is a 21 y.o. [id number] at [redacted]w[redacted]d by LMP who presents with abdominal pain & n/v. Symptoms began 3 days ago. Reports nausea & vomiting that is worse in the morning. Vomited once today. Currently not nauseated. Has not treated. Denies diarrhea or fever.  Lower abdominal pain that is dull & cramp like. Pain is intermittent and worse after vomiting. Rates pain 6/10. Has not treated. Denies vaginal bleeding, vaginal discharge, dysuria. Has positive & negative HPT at home.   OB History    Gravida Para Term Preterm AB Living   5 4 4  0 0 3   SAB TAB Ectopic Multiple Live Births   0 0 0 0 3      Past Medical History:  Diagnosis Date  . Hx of measles   . IUFD (intrauterine fetal death) - in previous pregnancy 12/06/2014  . Migraine     Past Surgical History:  Procedure Laterality Date  . NO PAST SURGERIES      Family History  Problem Relation Age of Onset  . Hypertension Mother   . Asthma Sister   . Asthma Brother     Social History  Substance Use Topics  . Smoking status: Never Smoker  . Smokeless tobacco: Never Used  . Alcohol use No    Allergies:  Allergies  Allergen Reactions  . Peanuts [Peanut Oil] Anaphylaxis and Hives    Prescriptions Prior to Admission  Medication Sig Dispense Refill Last Dose  . acetaminophen (TYLENOL) 325 MG tablet Take 2 tablets (650 mg total) by mouth every 4 (four) hours as needed (for pain scale < 4). 30 tablet 0   . ibuprofen (ADVIL,MOTRIN) 600 MG tablet Take 1 tablet (600 mg total) by mouth every 6 (six) hours. 30 tablet 0   . Prenatal Vit-Fe Fumarate-FA (PRENATAL MULTIVITAMIN) TABS tablet Take 1 tablet by mouth every morning. 30 tablet 10 02/11/2016 at Unknown time    Review of Systems   Constitutional: Negative.   Gastrointestinal: Positive for abdominal pain, nausea and vomiting. Negative for constipation and diarrhea.  Genitourinary: Negative.    Physical Exam   Blood pressure 131/78, pulse 85, temperature 98.4 F (36.9 C), temperature source Oral, resp. rate 18, height 5\' 1"  (1.549 m), weight 167 lb (75.8 kg), last menstrual period 11/18/2016, SpO2 100 %, unknown if currently breastfeeding.  Physical Exam  Nursing note and vitals reviewed. Constitutional: She is oriented to person, place, and time. She appears well-developed and well-nourished. No distress.  HENT:  Head: Normocephalic and atraumatic.  Eyes: Conjunctivae are normal. Right eye exhibits no discharge. Left eye exhibits no discharge. No scleral icterus.  Neck: Normal range of motion.  Cardiovascular: Normal rate, regular rhythm and normal heart sounds.   No murmur heard. Respiratory: Effort normal and breath sounds normal. No respiratory distress. She has no wheezes.  GI: Soft. Bowel sounds are normal. She exhibits no distension. There is no tenderness. There is no rebound and no guarding.  Genitourinary: Uterus is enlarged. Cervix exhibits no motion tenderness. Right adnexum displays no mass and no tenderness. Left adnexum displays no mass and no tenderness.  Genitourinary Comments: Cervix closed  Neurological: She is alert and oriented to person, place, and time.  Skin: Skin is warm  and dry. She is not diaphoretic.  Psychiatric: She has a normal mood and affect. Her behavior is normal. Judgment and thought content normal.    MAU Course  Procedures Results for orders placed or performed during the hospital encounter of 12/31/16 (from the past 24 hour(s))  Urinalysis, Routine w reflex microscopic     Status: Abnormal   Collection Time: 12/31/16  3:46 PM  Result Value Ref Range   Color, Urine YELLOW YELLOW   APPearance CLEAR CLEAR   Specific Gravity, Urine 1.032 (H) 1.005 - 1.030   pH 5.0 5.0 -  8.0   Glucose, UA NEGATIVE NEGATIVE mg/dL   Hgb urine dipstick NEGATIVE NEGATIVE   Bilirubin Urine NEGATIVE NEGATIVE   Ketones, ur NEGATIVE NEGATIVE mg/dL   Protein, ur NEGATIVE NEGATIVE mg/dL   Nitrite NEGATIVE NEGATIVE   Leukocytes, UA NEGATIVE NEGATIVE  Pregnancy, urine POC     Status: Abnormal   Collection Time: 12/31/16  4:30 PM  Result Value Ref Range   Preg Test, Ur POSITIVE (A) NEGATIVE  CBC     Status: None   Collection Time: 12/31/16  5:02 PM  Result Value Ref Range   WBC 10.0 4.0 - 10.5 K/uL   RBC 5.00 3.87 - 5.11 MIL/uL   Hemoglobin 14.1 12.0 - 15.0 g/dL   HCT 40.941.4 81.136.0 - 91.446.0 %   MCV 82.8 78.0 - 100.0 fL   MCH 28.2 26.0 - 34.0 pg   MCHC 34.1 30.0 - 36.0 g/dL   RDW 78.212.8 95.611.5 - 21.315.5 %   Platelets 286 150 - 400 K/uL    MDM +UPT UA, wet prep, GC/chlamydia, CBC, ABO/Rh, quant hCG, HIV, and US today to rule out ectopic pregnancy A positive  Care turned over to Dr. Marice Potterove who performed bedside ultrasound that showed IUGS. Patient asymptomatic at this time. Will have f/u in 48 hrs for BHCG.  Judeth HornLawrence, Erin, NP 12/31/2016 6:01 PM   Assessment and Plan  Nausea/vomitting in early pregnancy- rec OTC unisom and Vit B6, phenergan prescribed She will get her care in the clinic. [facility] but they no longer take Medicaid.   Judeth Hornrin Lawrence 12/31/2016, 5:33 PM

## 2016-12-31 NOTE — MAU Note (Signed)
Patient reports sharp lower abdominal pains x2 days; comes and goes. Unable to keep anything to eat or drink down x 1week; thrown up 3 times in the last 24 hrs. Denies vaginal bleeding or discharge. Patient states had 1 positive and 1 negative home pregnancy test. Patient unsure of last LMP; states has irregular periods and doesn't keep track.

## 2016-12-31 NOTE — Discharge Instructions (Signed)
Nausea and Vomiting, Adult Feeling sick to your stomach (nausea) means that your stomach is upset or you feel like you have to throw up (vomit). Feeling more and more sick to your stomach can lead to throwing up. Throwing up happens when food and liquid from your stomach are thrown up and out the mouth. Throwing up can make you feel weak and cause you to get dehydrated. Dehydration can make you tired and thirsty, make you have a dry mouth, and make it so you pee (urinate) less often. Older adults and people with other diseases or a weak defense system (immune system) are at higher risk for dehydration. If you feel sick to your stomach or if you throw up, it is important to follow instructions from your doctor about how to take care of yourself. Follow these instructions at home: Eating and drinking  Follow these instructions as told by your doctor:  Take an oral rehydration solution (ORS). This is a drink that is sold at pharmacies and stores.  Drink clear fluids in small amounts as you are able, such as:  Water.  Ice chips.  Diluted fruit juice.  Low-calorie sports drinks.  Eat bland, easy-to-digest foods in small amounts as you are able, such as:  Bananas.  Applesauce.  Rice.  Low-fat (lean) meats.  Toast.  Crackers.  Avoid fluids that have a lot of sugar or caffeine in them.  Avoid alcohol.  Avoid spicy or fatty foods. General instructions   Drink enough fluid to keep your pee (urine) clear or pale yellow.  Wash your hands often. If you cannot use soap and water, use hand sanitizer.  Make sure that all people in your home wash their hands well and often.  Take over-the-counter and prescription medicines only as told by your doctor.  Rest at home while you get better.  Watch your condition for any changes.  Breathe slowly and deeply when you feel sick to your stomach.  Keep all follow-up visits as told by your doctor. This is important. Contact a doctor  if:  You have a fever.  You cannot keep fluids down.  Your symptoms get worse.  You have new symptoms.  You feel sick to your stomach for more than two days.  You feel light-headed or dizzy.  You have a headache.  You have muscle cramps. Get help right away if:  You have pain in your chest, neck, arm, or jaw.  You feel very weak or you pass out (faint).  You throw up again and again.  You see blood in your throw-up.  Your throw-up looks like black coffee grounds.  You have bloody or black poop (stools) or poop that look like tar.  You have a very bad headache, a stiff neck, or both.  You have a rash.  You have very bad pain, cramping, or bloating in your belly (abdomen).  You have trouble breathing.  You are breathing very quickly.  Your heart is beating very quickly.  Your skin feels cold and clammy.  You feel confused.  You have pain when you pee.  You have signs of dehydration, such as:  Dark pee, hardly any pee, or no pee.  Cracked lips.  Dry mouth.  Sunken eyes.  Sleepiness.  Weakness. These symptoms may be an emergency. Do not wait to see if the symptoms will go away. Get medical help right away. Call your local emergency services (911 in the [place] Do not drive yourself to the hospital. This information   is not intended to replace advice given to you by your health care provider. Make sure you discuss any questions you have with your health care provider. Document Released: 01/29/2008 Document Revised: 03/01/2016 Document Reviewed: 04/18/2015 Elsevier Interactive Patient Education  2017 [employer]  

## 2016-12-31 NOTE — Progress Notes (Addendum)
G5P4L3. +UPT. Presents to triage for abdominal pain from dry heaving for past 3 days in the morning. She thinks abdominal pain is most likely from dry heaving. Presently denies abdominal pain. Denies bleeding.  VSS see flow sheet for details.   1737: Provider at bs assessing pt. Wetprep, GC, SVE done. SVE closed.   1750: U/S notified.

## 2017-01-01 ENCOUNTER — Telehealth: Payer: Self-pay | Admitting: *Deleted

## 2017-01-01 LAB — HIV ANTIBODY (ROUTINE TESTING W REFLEX): HIV SCREEN 4TH GENERATION: NONREACTIVE

## 2017-01-01 LAB — GC/CHLAMYDIA PROBE AMP (~~LOC~~) NOT AT ARMC
Chlamydia: NEGATIVE
Neisseria Gonorrhea: NEGATIVE

## 2017-01-01 NOTE — Telephone Encounter (Signed)
Per Dr. Marice Potterove, pt will need BHCG on 5/11 as a follow up to her visit @ [facility] yesterday.  I called pt and left message stating that we have scheduled appt on 5/11 @ 0820 for lab and she may need to wait for results - approximately 1 hour. Please call back and confirm receipt of this message.  Message sent to Antoinette to schedule appt for pt.

## 2017-01-03 ENCOUNTER — Ambulatory Visit: Payer: Medicaid Other

## 2017-01-03 ENCOUNTER — Encounter: Payer: Self-pay | Admitting: General Practice

## 2017-01-03 DIAGNOSIS — O3680X0 Pregnancy with inconclusive fetal viability, not applicable or unspecified: Secondary | ICD-10-CM

## 2017-01-03 LAB — HCG, QUANTITATIVE, PREGNANCY: HCG, BETA CHAIN, QUANT, S: 19987 m[IU]/mL — AB (ref <5)

## 2017-01-03 NOTE — Progress Notes (Signed)
Patient presented to office today for a stat bhcg. Patient reports no bleeding at this time. Patient has been informed to wait on results in our office. Patient voice understanding.  Per Dr.Arnold patient Shelby Olsen is rising good at this time. No u/s was scheduled and patient have schedule for viability on 01/05/2017 at 1:45. Patient provided with letter of verification to apply for medicaid.

## 2017-01-08 ENCOUNTER — Ambulatory Visit: Payer: Self-pay

## 2017-01-08 ENCOUNTER — Ambulatory Visit (HOSPITAL_COMMUNITY)
Admission: RE | Admit: 2017-01-08 | Discharge: 2017-01-08 | Disposition: A | Discharge status: Home / Self Care | Payer: Medicaid Other | Source: Ambulatory Visit | Attending: Obstetrics & Gynecology | Admitting: Obstetrics & Gynecology

## 2017-01-08 ENCOUNTER — Encounter: Payer: Self-pay | Admitting: Family Medicine

## 2017-01-08 DIAGNOSIS — Z3490 Encounter for supervision of normal pregnancy, unspecified, unspecified trimester: Secondary | ICD-10-CM | POA: Diagnosis present

## 2017-01-08 DIAGNOSIS — Z712 Person consulting for explanation of examination or test findings: Secondary | ICD-10-CM

## 2017-01-08 DIAGNOSIS — Z3491 Encounter for supervision of normal pregnancy, unspecified, first trimester: Secondary | ICD-10-CM | POA: Diagnosis not present

## 2017-01-08 DIAGNOSIS — O3680X0 Pregnancy with inconclusive fetal viability, not applicable or unspecified: Secondary | ICD-10-CM

## 2017-01-08 NOTE — Progress Notes (Signed)
Pt here for viability US results.  US results given to pt that she has a single living pregnancy.  Verified medications and recommended pt to start taking PNV.  Pt advised to received proof of pregnancy letter from the front office to start prenatal care.   Pt stated understanding with no further questions.

## 2017-04-02 ENCOUNTER — Ambulatory Visit (INDEPENDENT_AMBULATORY_CARE_PROVIDER_SITE_OTHER): Payer: Medicaid Other | Admitting: Certified Nurse Midwife

## 2017-04-02 ENCOUNTER — Encounter: Payer: Self-pay | Admitting: Certified Nurse Midwife

## 2017-04-02 VITALS — BP 121/75 | HR 104 | Temp 98.8°F | Wt 178.8 lb

## 2017-04-02 DIAGNOSIS — Z3482 Encounter for supervision of other normal pregnancy, second trimester: Secondary | ICD-10-CM

## 2017-04-02 DIAGNOSIS — O2342 Unspecified infection of urinary tract in pregnancy, second trimester: Secondary | ICD-10-CM

## 2017-04-02 DIAGNOSIS — Z3490 Encounter for supervision of normal pregnancy, unspecified, unspecified trimester: Secondary | ICD-10-CM

## 2017-04-03 ENCOUNTER — Other Ambulatory Visit (HOSPITAL_COMMUNITY)
Admission: RE | Admit: 2017-04-03 | Discharge: 2017-04-03 | Disposition: A | Discharge status: Home / Self Care | Payer: Medicaid Other | Source: Ambulatory Visit | Attending: Certified Nurse Midwife | Admitting: Certified Nurse Midwife

## 2017-04-03 DIAGNOSIS — Z3490 Encounter for supervision of normal pregnancy, unspecified, unspecified trimester: Secondary | ICD-10-CM | POA: Insufficient documentation

## 2017-04-03 NOTE — Progress Notes (Signed)
Patient presents for New OB visit, late to care. She recently migrated from [place].

## 2017-04-04 MED ORDER — PRENATE PIXIE 10-0.6-0.4-200 MG PO CAPS: 1.0000 | ORAL_CAPSULE | Freq: Every day | ORAL | 12 refills | Status: DC

## 2017-04-04 NOTE — Progress Notes (Addendum)
Subjective:    Shelby Olsen is being seen today for her first obstetrical visit.  This is a planned pregnancy. She is at [redacted]w[redacted]d gestation. Her obstetrical history is significant for IUFD at term. Relationship with FOB: significant other, living together. Patient does intend to breast feed. Pregnancy history fully reviewed.   Works at [employer].    The information documented in the HPI was reviewed and verified.  Menstrual History: OB History    Gravida Para Term Preterm AB Living   5 4 4  0 0 3   SAB TAB Ectopic Multiple Live Births   0 0 0 0 3       Patient's last menstrual period was 11/18/2016 (approximate).    Past Medical History:  Diagnosis Date  . Hx of measles   . IUFD (intrauterine fetal death) - in previous pregnancy 12/06/2014  . Migraine     Past Surgical History:  Procedure Laterality Date  . NO PAST SURGERIES       (Not in a hospital admission) Allergies  Allergen Reactions  . Peanuts [Peanut Oil] Anaphylaxis and Hives    Social History  Substance Use Topics  . Smoking status: Never Smoker  . Smokeless tobacco: Never Used  . Alcohol use No    Family History  Problem Relation Age of Onset  . Hypertension Mother   . Asthma Sister   . Learning disabilities Sister   . Asthma Brother   . Birth defects Brother   . Learning disabilities Brother   . Mental retardation Brother   . Cancer Maternal Grandmother      Review of Systems Constitutional: negative for weight loss Gastrointestinal: negative for vomiting Genitourinary:negative for genital lesions and vaginal discharge and dysuria Musculoskeletal:negative for back pain Behavioral/Psych: negative for abusive relationship, depression, illegal drug usage and tobacco use    Objective:    BP 121/75   Pulse (!) 104   Temp 98.8 F (37.1 C)   Wt 178 lb 12.8 oz (81.1 kg)   LMP 11/18/2016 (Approximate)   BMI 33.78 kg/m  General Appearance:    Alert, cooperative, no distress, appears stated age   Head:    Normocephalic, without obvious abnormality, atraumatic  Eyes:    PERRL, conjunctiva/corneas clear, EOM's intact, fundi    benign, both eyes  Ears:    Normal TM's and external ear canals, both ears  Nose:   Nares normal, septum midline, mucosa normal, no drainage    or sinus tenderness  Throat:   Lips, mucosa, and tongue normal; teeth and gums normal  Neck:   Supple, symmetrical, trachea midline, no adenopathy;    thyroid:  no enlargement/tenderness/nodules; no carotid   bruit or JVD  Back:     Symmetric, no curvature, ROM normal, no CVA tenderness  Lungs:     Clear to auscultation bilaterally, respirations unlabored  Chest Wall:    No tenderness or deformity   Heart:    Regular rate and rhythm, S1 and S2 normal, no murmur, rub   or gallop  Breast Exam:    No tenderness, masses, or nipple abnormality  Abdomen:     Soft, non-tender, bowel sounds active all four quadrants,    no masses, no organomegaly  Genitalia:    Normal female without lesion, discharge or tenderness  Extremities:   Extremities normal, atraumatic, no cyanosis or edema  Pulses:   2+ and symmetric all extremities  Skin:   Skin color, texture, turgor normal, no rashes or lesions  Lymph nodes:  Cervical, supraclavicular, and axillary nodes normal  Neurologic:   CNII-XII intact, normal strength, sensation and reflexes    throughout         Cervix:  Long, thick, closed and posterior.  FHR: 150 By doppler.  Size c/w dates.     Lab Review Urine pregnancy test Labs reviewed yes Radiologic studies reviewed yes.  Assessment & Plan    Pregnancy at [redacted]w[redacted]d weeks    1. Encounter for supervision of normal pregnancy, antepartum, unspecified gravidity    - Obstetric Panel, Including HIV - Hemoglobinopathy evaluation - Cystic Fibrosis Mutation 97 - Culture, OB Urine - Cervicovaginal ancillary only - Cytology - PAP - Varicella zoster antibody, IgG - MaterniT21 PLUS Core+SCA - AFP, Serum, Open Spina Bifida -  Hemoglobin A1c     Prenatal vitamins.  Counseling provided regarding continued use of seat belts, cessation of alcohol consumption, smoking or use of illicit drugs; infection precautions i.e., influenza/TDAP immunizations, toxoplasmosis,CMV, parvovirus, listeria and varicella; workplace safety, exercise during pregnancy; routine dental care, safe medications, sexual activity, hot tubs, saunas, pools, travel, caffeine use, fish and methlymercury, potential toxins, hair treatments, varicose veins Weight gain recommendations per IOM guidelines reviewed: underweight/BMI< 18.5--> gain 28 - 40 lbs; normal weight/BMI 18.5 - 24.9--> gain 25 - 35 lbs; overweight/BMI 25 - 29.9--> gain 15 - 25 lbs; obese/BMI >30->gain  11 - 20 lbs Problem list reviewed and updated. FIRST/CF mutation testing/NIPT/QUAD SCREEN/fragile X/Ashkenazi Jewish population testing/Spinal muscular atrophy discussed: ordered. Role of ultrasound in pregnancy discussed; fetal survey: ordered.   Orders Placed This Encounter  Procedures  . Culture, OB Urine  . Obstetric Panel, Including HIV  . Hemoglobinopathy evaluation  . Cystic Fibrosis Mutation 97  . Varicella zoster antibody, IgG  . MaterniT21 PLUS Core+SCA    Order Specific Question:   Is the patient insulin dependent?    Answer:   No    Order Specific Question:   Please enter gestational age. This should be expressed as weeks AND days, i.e. 16w 6d. Enter weeks here. Enter days in next question.    Answer:   48    Order Specific Question:   Please enter gestational age. This should be expressed as weeks AND days, i.e. 16w 6d. Enter days here. Enter weeks in previous question.    Answer:   3    Order Specific Question:   How was gestational age calculated?    Answer:   LMP    Order Specific Question:   Please give the date of LMP OR Ultrasound OR Estimated date of delivery.    Answer:   08/25/2017    Order Specific Question:   Number of Fetuses (Type of Pregnancy):    Answer:    1    Order Specific Question:   Indications for performing the test? (please choose all that apply):    Answer:   Routine screening    Order Specific Question:   Other Indications? (Y=Yes, N=No)    Answer:   N    Order Specific Question:   If this is a repeat specimen, please indicate the reason:    Answer:   Not indicated    Order Specific Question:   Please specify the patient's race: (C=White/Caucasion, B=Black, I=Native American, A=Asian, H=Hispanic, O=Other, U=Unknown)    Answer:   B    Order Specific Question:   Donor Egg - indicate if the egg was obtained from in vitro fertilization.    Answer:   N    Order Specific Question:  Age of Egg Donor.    Answer:   [age]    Order Specific Question:   Prior Down Syndrome/ONTD screening during current pregnancy.    Answer:   N    Order Specific Question:   Prior First Trimester Testing    Answer:   N    Order Specific Question:   Prior Second Trimester Testing    Answer:   N    Order Specific Question:   Family History of Neural Tube Defects    Answer:   N    Order Specific Question:   Prior Pregnancy with Down Syndrome    Answer:   N    Order Specific Question:   Please give the patient's weight (in pounds)    Answer:   167  . AFP, Serum, Open Spina Bifida    Order Specific Question:   Is patient insulin dependent?    Answer:   No    Order Specific Question:   Weight (lbs)    Answer:   39167    Order Specific Question:   Gestational Age (GA), weeks    Answer:   19.3    Order Specific Question:   Date on which patient was at this GA    Answer:   04/03/2017    Order Specific Question:   GA Calculation Method    Answer:   LMP    Order Specific Question:   GA Date    Answer:   08/25/2017    Order Specific Question:   Number of fetuses    Answer:   1    Order Specific Question:   Reason for screen    Answer:   PDOWNS    Order Specific Question:   Donor egg?    Answer:   N    Order Specific Question:   Age of egg donor?    Answer:    21  . Hemoglobin A1c    Follow up in 4 weeks. 50% of 30 min visit spent on counseling and coordination of care.

## 2017-04-07 ENCOUNTER — Other Ambulatory Visit: Payer: Medicaid Other

## 2017-04-07 LAB — CYTOLOGY - PAP: Diagnosis: NEGATIVE

## 2017-04-07 LAB — CERVICOVAGINAL ANCILLARY ONLY
BACTERIAL VAGINITIS: NEGATIVE
CANDIDA VAGINITIS: NEGATIVE
Chlamydia: NEGATIVE
NEISSERIA GONORRHEA: NEGATIVE
Trichomonas: NEGATIVE

## 2017-04-07 MED ORDER — NITROFURANTOIN MONOHYD MACRO 100 MG PO CAPS: 100.0000 mg | ORAL_CAPSULE | Freq: Two times a day (BID) | ORAL | 0 refills | Status: DC

## 2017-04-07 NOTE — Addendum Note (Signed)
Addended by: Marjo BickerENNEY, Chez Bulnes A on: 04/07/2017 09:00 AM   Modules accepted: Orders

## 2017-04-07 NOTE — Addendum Note (Signed)
Addended by: Orvilla CornwallENNEY, RACHELLE A on: 04/07/2017 01:37 PM   Modules accepted: Orders

## 2017-04-08 ENCOUNTER — Other Ambulatory Visit: Payer: Self-pay | Admitting: Certified Nurse Midwife

## 2017-04-08 LAB — URINE CULTURE, OB REFLEX

## 2017-04-08 LAB — CULTURE, OB URINE

## 2017-04-09 ENCOUNTER — Other Ambulatory Visit: Payer: Self-pay | Admitting: Certified Nurse Midwife

## 2017-04-09 ENCOUNTER — Other Ambulatory Visit (HOSPITAL_COMMUNITY): Payer: Self-pay | Admitting: *Deleted

## 2017-04-09 ENCOUNTER — Encounter (HOSPITAL_COMMUNITY): Payer: Self-pay

## 2017-04-09 ENCOUNTER — Other Ambulatory Visit: Payer: Medicaid Other

## 2017-04-09 ENCOUNTER — Ambulatory Visit (HOSPITAL_COMMUNITY)
Admission: RE | Admit: 2017-04-09 | Discharge: 2017-04-09 | Disposition: A | Discharge status: Home / Self Care | Payer: Medicaid Other | Source: Ambulatory Visit | Attending: Certified Nurse Midwife | Admitting: Certified Nurse Midwife

## 2017-04-09 DIAGNOSIS — O99212 Obesity complicating pregnancy, second trimester: Secondary | ICD-10-CM | POA: Diagnosis not present

## 2017-04-09 DIAGNOSIS — O09292 Supervision of pregnancy with other poor reproductive or obstetric history, second trimester: Secondary | ICD-10-CM | POA: Diagnosis not present

## 2017-04-09 DIAGNOSIS — Z3689 Encounter for other specified antenatal screening: Secondary | ICD-10-CM

## 2017-04-09 DIAGNOSIS — Z8759 Personal history of other complications of pregnancy, childbirth and the puerperium: Secondary | ICD-10-CM

## 2017-04-09 DIAGNOSIS — O09892 Supervision of other high risk pregnancies, second trimester: Secondary | ICD-10-CM

## 2017-04-09 DIAGNOSIS — Z3490 Encounter for supervision of normal pregnancy, unspecified, unspecified trimester: Secondary | ICD-10-CM

## 2017-04-09 DIAGNOSIS — O0932 Supervision of pregnancy with insufficient antenatal care, second trimester: Secondary | ICD-10-CM

## 2017-04-09 DIAGNOSIS — Z3A19 19 weeks gestation of pregnancy: Secondary | ICD-10-CM | POA: Insufficient documentation

## 2017-04-09 DIAGNOSIS — Z3480 Encounter for supervision of other normal pregnancy, unspecified trimester: Secondary | ICD-10-CM

## 2017-04-09 DIAGNOSIS — O2342 Unspecified infection of urinary tract in pregnancy, second trimester: Secondary | ICD-10-CM | POA: Insufficient documentation

## 2017-04-09 MED ORDER — CEPHALEXIN 500 MG PO CAPS: 500.0000 mg | ORAL_CAPSULE | Freq: Three times a day (TID) | ORAL | 0 refills | Status: DC

## 2017-04-14 ENCOUNTER — Other Ambulatory Visit: Payer: Self-pay | Admitting: Certified Nurse Midwife

## 2017-04-14 DIAGNOSIS — Z3480 Encounter for supervision of other normal pregnancy, unspecified trimester: Secondary | ICD-10-CM

## 2017-04-30 ENCOUNTER — Encounter: Payer: Medicaid Other | Admitting: Certified Nurse Midwife

## 2017-05-21 ENCOUNTER — Ambulatory Visit (HOSPITAL_COMMUNITY)
Admission: RE | Admit: 2017-05-21 | Discharge: 2017-05-21 | Disposition: A | Discharge status: Home / Self Care | Payer: Medicaid Other | Source: Ambulatory Visit | Attending: Certified Nurse Midwife | Admitting: Certified Nurse Midwife

## 2017-05-21 ENCOUNTER — Encounter (HOSPITAL_COMMUNITY): Payer: Self-pay

## 2017-05-21 DIAGNOSIS — O99212 Obesity complicating pregnancy, second trimester: Secondary | ICD-10-CM | POA: Diagnosis not present

## 2017-05-21 DIAGNOSIS — Z3A35 35 weeks gestation of pregnancy: Secondary | ICD-10-CM | POA: Diagnosis not present

## 2017-05-21 DIAGNOSIS — O09892 Supervision of other high risk pregnancies, second trimester: Secondary | ICD-10-CM | POA: Insufficient documentation

## 2017-05-21 DIAGNOSIS — O09292 Supervision of pregnancy with other poor reproductive or obstetric history, second trimester: Secondary | ICD-10-CM | POA: Insufficient documentation

## 2017-05-21 DIAGNOSIS — O0932 Supervision of pregnancy with insufficient antenatal care, second trimester: Secondary | ICD-10-CM | POA: Diagnosis not present

## 2017-05-21 DIAGNOSIS — Z3480 Encounter for supervision of other normal pregnancy, unspecified trimester: Secondary | ICD-10-CM

## 2017-05-21 NOTE — Addendum Note (Signed)
Encounter addended by: Ellin Saba on: 05/21/2017  3:53 PM<BR>    Actions taken: Imaging Exam ended

## 2017-05-22 ENCOUNTER — Other Ambulatory Visit (HOSPITAL_COMMUNITY): Payer: Self-pay | Admitting: *Deleted

## 2017-05-22 DIAGNOSIS — Z8759 Personal history of other complications of pregnancy, childbirth and the puerperium: Secondary | ICD-10-CM

## 2017-05-26 ENCOUNTER — Ambulatory Visit (INDEPENDENT_AMBULATORY_CARE_PROVIDER_SITE_OTHER): Payer: Medicaid Other | Admitting: Certified Nurse Midwife

## 2017-05-26 ENCOUNTER — Encounter: Payer: Medicaid Other | Admitting: Certified Nurse Midwife

## 2017-05-26 VITALS — BP 117/74 | HR 94 | Wt 185.0 lb

## 2017-05-26 DIAGNOSIS — Z3482 Encounter for supervision of other normal pregnancy, second trimester: Secondary | ICD-10-CM

## 2017-05-26 DIAGNOSIS — Z8759 Personal history of other complications of pregnancy, childbirth and the puerperium: Secondary | ICD-10-CM | POA: Diagnosis not present

## 2017-05-26 DIAGNOSIS — Z3480 Encounter for supervision of other normal pregnancy, unspecified trimester: Secondary | ICD-10-CM

## 2017-05-26 NOTE — Progress Notes (Signed)
   PRENATAL VISIT NOTE  Subjective:  Shelby Olsen is a 21 y.o. G5P4003 at [redacted]w[redacted]d being seen today for ongoing prenatal care.  She is currently monitored for the following issues for this high-risk pregnancy and has History of IUFD; BMI 39.0-39.9,adult; Late prenatal care; History of migraine headaches - no meds; Short stature; Supervision of normal pregnancy, antepartum; and UTI (urinary tract infection) during pregnancy, second trimester on her problem list.  Patient reports no complaints.  Contractions: Not present. Vag. Bleeding: None.  Movement: Present. Denies leaking of fluid.   The following portions of the patient's history were reviewed and updated as appropriate: allergies, current medications, past family history, past medical history, past social history, past surgical history and problem list. Problem list updated.  Objective:   Vitals:   05/26/17 1312  BP: 117/74  Pulse: 94  Weight: 185 lb (83.9 kg)    Fetal Status: Fetal Heart Rate (bpm): 148; doppler Fundal Height: 27 cm Movement: Present     General:  Alert, oriented and cooperative. Patient is in no acute distress.  Skin: Skin is warm and dry. No rash noted.   Cardiovascular: Normal heart rate noted  Respiratory: Normal respiratory effort, no problems with respiration noted  Abdomen: Soft, gravid, appropriate for gestational age.  Pain/Pressure: Absent     Pelvic: Cervical exam deferred        Extremities: Normal range of motion.  Edema: None  Mental Status:  Normal mood and affect. Normal behavior. Normal judgment and thought content.   Assessment and Plan:  Pregnancy: G5P4003 at [redacted]w[redacted]d  1. Supervision of other normal pregnancy, antepartum     Changed to high risk of IUFD at term.  F/U US scheduled.  - MaterniT21 PLUS Core+SCA - Culture, OB Urine  2. History of IUFD     . BPP ordered for 28 wks.   Preterm labor symptoms and general obstetric precautions including but not limited to vaginal  bleeding, contractions, leaking of fluid and fetal movement were reviewed in detail with the patient. Please refer to After Visit Summary for other counseling recommendations.  Return in about 2 weeks (around 06/09/2017) for 2 hr OGTT, HOB.   Roe Coombs, CNM

## 2017-05-30 LAB — OBSTETRIC PANEL, INCLUDING HIV
Antibody Screen: NEGATIVE
BASOS ABS: 0 10*3/uL (ref 0.0–0.2)
Basos: 0 %
EOS (ABSOLUTE): 0.1 10*3/uL (ref 0.0–0.4)
Eos: 1 %
HEP B S AG: NEGATIVE
HIV SCREEN 4TH GENERATION: NONREACTIVE
Hematocrit: 35.9 % (ref 34.0–46.6)
Hemoglobin: 12.1 g/dL (ref 11.1–15.9)
IMMATURE GRANULOCYTES: 0 %
Immature Grans (Abs): 0 10*3/uL (ref 0.0–0.1)
LYMPHS ABS: 2 10*3/uL (ref 0.7–3.1)
Lymphs: 19 %
MCH: 28.5 pg (ref 26.6–33.0)
MCHC: 33.7 g/dL (ref 31.5–35.7)
MCV: 85 fL (ref 79–97)
MONOS ABS: 0.8 10*3/uL (ref 0.1–0.9)
Monocytes: 8 %
NEUTROS ABS: 7.6 10*3/uL — AB (ref 1.4–7.0)
NEUTROS PCT: 72 %
Platelets: 231 10*3/uL (ref 150–379)
RBC: 4.24 x10E6/uL (ref 3.77–5.28)
RDW: 13.4 % (ref 12.3–15.4)
RPR Ser Ql: NONREACTIVE
Rh Factor: POSITIVE
Rubella Antibodies, IGG: 4.36 index (ref >0.99)
WBC: 10.6 10*3/uL (ref 3.4–10.8)

## 2017-05-30 LAB — URINE CULTURE, OB REFLEX

## 2017-05-30 LAB — HEMOGLOBINOPATHY EVALUATION
HGB A: 97.4 % (ref 96.4–98.8)
HGB C: 0 %
HGB S: 0 %
HGB VARIANT: 0 %
Hemoglobin A2 Quantitation: 2.6 % (ref 1.8–3.2)
Hemoglobin F Quantitation: 0 % (ref 0.0–2.0)

## 2017-05-30 LAB — VARICELLA ZOSTER ANTIBODY, IGG: Varicella zoster IgG: 1967 index (ref >165)

## 2017-05-30 LAB — HEMOGLOBIN A1C
ESTIMATED AVERAGE GLUCOSE: 100 mg/dL
HEMOGLOBIN A1C: 5.1 % (ref 4.8–5.6)

## 2017-05-30 LAB — CULTURE, OB URINE

## 2017-05-31 LAB — MATERNIT21 PLUS CORE+SCA
CHROMOSOME 18: NEGATIVE
CHROMOSOME 21: NEGATIVE
Chromosome 13: NEGATIVE
Y Chromosome: DETECTED

## 2017-06-02 ENCOUNTER — Other Ambulatory Visit: Payer: Self-pay | Admitting: Certified Nurse Midwife

## 2017-06-02 ENCOUNTER — Telehealth: Payer: Self-pay

## 2017-06-02 DIAGNOSIS — O2342 Unspecified infection of urinary tract in pregnancy, second trimester: Secondary | ICD-10-CM

## 2017-06-02 DIAGNOSIS — Z3480 Encounter for supervision of other normal pregnancy, unspecified trimester: Secondary | ICD-10-CM

## 2017-06-02 MED ORDER — CIPROFLOXACIN HCL 500 MG PO TABS: 500.0000 mg | ORAL_TABLET | Freq: Two times a day (BID) | ORAL | 0 refills | Status: DC

## 2017-06-02 NOTE — Telephone Encounter (Signed)
Attempted to contact about results and rx sent, no answer, left vm. 

## 2017-06-03 ENCOUNTER — Other Ambulatory Visit: Payer: Self-pay | Admitting: Certified Nurse Midwife

## 2017-06-03 DIAGNOSIS — Z3480 Encounter for supervision of other normal pregnancy, unspecified trimester: Secondary | ICD-10-CM

## 2017-06-03 LAB — CYSTIC FIBROSIS MUTATION 97: GENE DIS ANAL CARRIER INTERP BLD/T-IMP: NOT DETECTED

## 2017-06-09 ENCOUNTER — Encounter (HOSPITAL_COMMUNITY): Payer: Self-pay

## 2017-06-10 ENCOUNTER — Other Ambulatory Visit: Payer: Medicaid Other

## 2017-06-10 ENCOUNTER — Encounter (HOSPITAL_COMMUNITY): Payer: Self-pay

## 2017-06-10 ENCOUNTER — Other Ambulatory Visit: Payer: Self-pay | Admitting: Certified Nurse Midwife

## 2017-06-10 ENCOUNTER — Ambulatory Visit (HOSPITAL_COMMUNITY)
Admission: RE | Admit: 2017-06-10 | Discharge: 2017-06-10 | Disposition: A | Discharge status: Home / Self Care | Payer: Medicaid Other | Source: Ambulatory Visit | Attending: Certified Nurse Midwife | Admitting: Certified Nurse Midwife

## 2017-06-10 ENCOUNTER — Encounter: Payer: Medicaid Other | Admitting: Obstetrics & Gynecology

## 2017-06-10 DIAGNOSIS — O09893 Supervision of other high risk pregnancies, third trimester: Secondary | ICD-10-CM

## 2017-06-10 DIAGNOSIS — O09293 Supervision of pregnancy with other poor reproductive or obstetric history, third trimester: Secondary | ICD-10-CM | POA: Insufficient documentation

## 2017-06-10 DIAGNOSIS — O99213 Obesity complicating pregnancy, third trimester: Secondary | ICD-10-CM | POA: Insufficient documentation

## 2017-06-10 DIAGNOSIS — O0933 Supervision of pregnancy with insufficient antenatal care, third trimester: Secondary | ICD-10-CM | POA: Diagnosis not present

## 2017-06-10 DIAGNOSIS — Z8759 Personal history of other complications of pregnancy, childbirth and the puerperium: Secondary | ICD-10-CM

## 2017-06-10 DIAGNOSIS — E669 Obesity, unspecified: Secondary | ICD-10-CM | POA: Diagnosis not present

## 2017-06-10 DIAGNOSIS — Z3480 Encounter for supervision of other normal pregnancy, unspecified trimester: Secondary | ICD-10-CM

## 2017-06-10 DIAGNOSIS — Z3A28 28 weeks gestation of pregnancy: Secondary | ICD-10-CM | POA: Insufficient documentation

## 2017-06-10 DIAGNOSIS — O09299 Supervision of pregnancy with other poor reproductive or obstetric history, unspecified trimester: Secondary | ICD-10-CM | POA: Diagnosis not present

## 2017-06-10 DIAGNOSIS — Z6839 Body mass index (BMI) 39.0-39.9, adult: Secondary | ICD-10-CM

## 2017-06-11 ENCOUNTER — Other Ambulatory Visit (HOSPITAL_COMMUNITY): Payer: Self-pay | Admitting: *Deleted

## 2017-06-11 DIAGNOSIS — Z8759 Personal history of other complications of pregnancy, childbirth and the puerperium: Secondary | ICD-10-CM

## 2017-06-18 ENCOUNTER — Ambulatory Visit (HOSPITAL_COMMUNITY)
Admission: RE | Admit: 2017-06-18 | Discharge: 2017-06-18 | Disposition: A | Discharge status: Home / Self Care | Payer: Medicaid Other | Source: Ambulatory Visit | Attending: Certified Nurse Midwife | Admitting: Certified Nurse Midwife

## 2017-06-18 ENCOUNTER — Encounter (HOSPITAL_COMMUNITY): Payer: Self-pay

## 2017-06-18 DIAGNOSIS — Z3A29 29 weeks gestation of pregnancy: Secondary | ICD-10-CM | POA: Insufficient documentation

## 2017-06-18 DIAGNOSIS — O09893 Supervision of other high risk pregnancies, third trimester: Secondary | ICD-10-CM | POA: Insufficient documentation

## 2017-06-18 DIAGNOSIS — O0933 Supervision of pregnancy with insufficient antenatal care, third trimester: Secondary | ICD-10-CM | POA: Insufficient documentation

## 2017-06-18 DIAGNOSIS — O09293 Supervision of pregnancy with other poor reproductive or obstetric history, third trimester: Secondary | ICD-10-CM | POA: Insufficient documentation

## 2017-06-18 DIAGNOSIS — Z3480 Encounter for supervision of other normal pregnancy, unspecified trimester: Secondary | ICD-10-CM

## 2017-06-18 DIAGNOSIS — O99213 Obesity complicating pregnancy, third trimester: Secondary | ICD-10-CM | POA: Insufficient documentation

## 2017-06-18 DIAGNOSIS — Z8759 Personal history of other complications of pregnancy, childbirth and the puerperium: Secondary | ICD-10-CM | POA: Insufficient documentation

## 2017-06-25 ENCOUNTER — Ambulatory Visit (HOSPITAL_COMMUNITY): Admission: RE | Admit: 2017-06-25 | Payer: Medicaid Other | Source: Ambulatory Visit

## 2017-07-02 ENCOUNTER — Other Ambulatory Visit (HOSPITAL_COMMUNITY): Payer: Self-pay | Admitting: *Deleted

## 2017-07-02 ENCOUNTER — Ambulatory Visit (HOSPITAL_COMMUNITY)
Admission: RE | Admit: 2017-07-02 | Discharge: 2017-07-02 | Disposition: A | Discharge status: Home / Self Care | Payer: Medicaid Other | Source: Ambulatory Visit | Attending: Certified Nurse Midwife | Admitting: Certified Nurse Midwife

## 2017-07-02 ENCOUNTER — Encounter (HOSPITAL_COMMUNITY): Payer: Self-pay

## 2017-07-02 DIAGNOSIS — Z8759 Personal history of other complications of pregnancy, childbirth and the puerperium: Secondary | ICD-10-CM

## 2017-07-02 DIAGNOSIS — O0933 Supervision of pregnancy with insufficient antenatal care, third trimester: Secondary | ICD-10-CM | POA: Insufficient documentation

## 2017-07-02 DIAGNOSIS — E669 Obesity, unspecified: Secondary | ICD-10-CM | POA: Diagnosis not present

## 2017-07-02 DIAGNOSIS — Z3480 Encounter for supervision of other normal pregnancy, unspecified trimester: Secondary | ICD-10-CM

## 2017-07-02 DIAGNOSIS — O09893 Supervision of other high risk pregnancies, third trimester: Secondary | ICD-10-CM | POA: Diagnosis not present

## 2017-07-02 DIAGNOSIS — O99213 Obesity complicating pregnancy, third trimester: Secondary | ICD-10-CM | POA: Insufficient documentation

## 2017-07-02 DIAGNOSIS — O09293 Supervision of pregnancy with other poor reproductive or obstetric history, third trimester: Secondary | ICD-10-CM | POA: Diagnosis not present

## 2017-07-02 DIAGNOSIS — Z3A31 31 weeks gestation of pregnancy: Secondary | ICD-10-CM | POA: Insufficient documentation

## 2017-07-09 ENCOUNTER — Ambulatory Visit (HOSPITAL_COMMUNITY)
Admission: RE | Admit: 2017-07-09 | Discharge: 2017-07-09 | Disposition: A | Discharge status: Home / Self Care | Payer: Medicaid Other | Source: Ambulatory Visit | Attending: Certified Nurse Midwife | Admitting: Certified Nurse Midwife

## 2017-07-09 ENCOUNTER — Encounter: Payer: Medicaid Other | Admitting: Obstetrics and Gynecology

## 2017-07-09 ENCOUNTER — Inpatient Hospital Stay (HOSPITAL_COMMUNITY)
Admission: AD | Admit: 2017-07-09 | Discharge: 2017-07-09 | Disposition: A | Discharge status: Home / Self Care | Payer: Medicaid Other | Source: Ambulatory Visit | Attending: Obstetrics and Gynecology | Admitting: Obstetrics and Gynecology

## 2017-07-09 ENCOUNTER — Encounter (HOSPITAL_COMMUNITY): Payer: Self-pay

## 2017-07-09 ENCOUNTER — Other Ambulatory Visit: Payer: Medicaid Other

## 2017-07-09 DIAGNOSIS — O4703 False labor before 37 completed weeks of gestation, third trimester: Secondary | ICD-10-CM | POA: Diagnosis not present

## 2017-07-09 DIAGNOSIS — Z3A32 32 weeks gestation of pregnancy: Secondary | ICD-10-CM | POA: Diagnosis not present

## 2017-07-09 LAB — URINALYSIS, ROUTINE W REFLEX MICROSCOPIC
BILIRUBIN URINE: NEGATIVE
Glucose, UA: NEGATIVE mg/dL
HGB URINE DIPSTICK: NEGATIVE
KETONES UR: NEGATIVE mg/dL
Leukocytes, UA: NEGATIVE
Nitrite: NEGATIVE
PROTEIN: NEGATIVE mg/dL
SPECIFIC GRAVITY, URINE: 1.01 (ref 1.005–1.030)
pH: 7 (ref 5.0–8.0)

## 2017-07-09 LAB — WET PREP, GENITAL
CLUE CELLS WET PREP: NONE SEEN
SPERM: NONE SEEN
Trich, Wet Prep: NONE SEEN
Yeast Wet Prep HPF POC: NONE SEEN

## 2017-07-09 LAB — FETAL FIBRONECTIN: FETAL FIBRONECTIN: NEGATIVE

## 2017-07-09 MED ORDER — NIFEDIPINE 10 MG PO CAPS
10.0000 mg | ORAL_CAPSULE | ORAL | Status: AC | PRN
  Administered 2017-07-09 (×3): 10 mg via ORAL
  Filled 2017-07-09 (×3): qty 1

## 2017-07-09 MED ORDER — LACTATED RINGERS IV BOLUS (SEPSIS)
1000.0000 mL | Freq: Once | INTRAVENOUS | Status: AC
  Administered 2017-07-09: 1000 mL via INTRAVENOUS

## 2017-07-09 MED ORDER — LACTATED RINGERS IV SOLN
INTRAVENOUS | Status: DC
  Administered 2017-07-09: 13:00:00 via INTRAVENOUS

## 2017-07-09 NOTE — MAU Note (Signed)
Pt presents with complaint of contractions since 9 am

## 2017-07-09 NOTE — MAU Provider Note (Signed)
Chief Complaint:  Contractions   First Provider Initiated Contact with Patient 07/09/17 1105      HPI: Shelby Olsen is a 21 y.o. G5P4003 at 2361w1d who presents to maternity admissions reporting onset of painful contractions this morning, becoming closer together and stronger since onset. She denies any associated symptoms. She has not tried any treatments. She had prenatal appt today but missed her appointment to come to [facility]U because of her pain. Her pain is in her low abdomen, radiating to her low back every 2-3 minutes.  She reports good fetal movement, denies LOF, vaginal bleeding, vaginal itching/burning, urinary symptoms, h/a, dizziness, n/v, or fever/chills.    HPI  Past Medical History: Past Medical History:  Diagnosis Date  . Hx of measles   . IUFD (intrauterine fetal death) - in previous pregnancy 12/06/2014  . Migraine     Past obstetric history: OB History  Gravida Para Term Preterm AB Living  5 4 4  0 0 3  SAB TAB Ectopic Multiple Live Births  0 0 0 0 3    # Outcome Date GA Lbr Len/2nd Weight Sex Delivery Anes PTL Lv  5 Current           4 Term 02/12/16 5264w5d 07:40 / 00:08 6 lb 9.5 oz (2.99 kg) M Vag-Spont EPI  LIV  3 Term 12/08/14 5461w2d 02:57 / 00:45 5 lb 11.2 oz (2.586 kg) M Vag-Spont EPI  LIV  2 Term 07/13/13 4761w2d 05:37 / 00:06 5 lb 8.8 oz (2.517 kg) F Vag-Spont None  FD  1 Term 06/10/11 7431w0d  5 lb 8 oz (2.495 kg) M Vag-Spont EPI  LIV      Past Surgical History: Past Surgical History:  Procedure Laterality Date  . NO PAST SURGERIES      Family History: Family History  Problem Relation Age of Onset  . Hypertension Mother   . Asthma Sister   . Learning disabilities Sister   . Asthma Brother   . Birth defects Brother   . Learning disabilities Brother   . Mental retardation Brother   . Cancer Maternal Grandmother     Social History: Social History   Tobacco Use  . Smoking status: Never Smoker  . Smokeless tobacco: Never Used  Substance Use  Topics  . Alcohol use: No  . Drug use: No    Comment: around people who smokes marijuana. SO smells of it.     Allergies:  Allergies  Allergen Reactions  . Peanuts [Peanut Oil] Anaphylaxis and Hives    Meds:  Medications Prior to Admission  Medication Sig Dispense Refill Last Dose  . Prenatal Vit-Fe Fumarate-FA (PRENATAL MULTIVITAMIN) TABS tablet Take 1 tablet by mouth every morning. 30 tablet 10 07/08/2017 at Unknown time  . ciprofloxacin (CIPRO) 500 MG tablet Take 1 tablet (500 mg total) by mouth 2 (two) times daily. (Patient not taking: Reported on 07/02/2017) 14 tablet 0 Not Taking  . Prenat-FeAsp-Meth-FA-DHA w/o A (PRENATE PIXIE) 10-0.6-0.4-200 MG CAPS Take 1 tablet by mouth daily. (Patient not taking: Reported on 05/26/2017) 30 capsule 12 Not Taking    ROS:  Review of Systems  Constitutional: Negative for chills, fatigue and fever.  Eyes: Negative for visual disturbance.  Respiratory: Negative for shortness of breath.   Cardiovascular: Negative for chest pain.  Gastrointestinal: Positive for abdominal pain. Negative for nausea and vomiting.  Genitourinary: Positive for pelvic pain. Negative for difficulty urinating, dysuria, flank pain, vaginal bleeding, vaginal discharge and vaginal pain.  Musculoskeletal: Positive for back pain.  Neurological: Negative for dizziness and headaches.  Psychiatric/Behavioral: Negative.      I have reviewed patient's Past Medical Hx, Surgical Hx, Family Hx, Social Hx, medications and allergies.   Physical Exam   Patient Vitals for the past 24 hrs:  BP Temp Pulse Resp SpO2  07/09/17 1500 115/62 - 93 18 -  07/09/17 1252 (!) 122/57 - - - -  07/09/17 1223 125/69 - - - -  07/09/17 1148 129/67 - - - -  07/09/17 1055 130/90 98.7 F (37.1 C) 92 20 100 %   Constitutional: Well-developed, well-nourished female in no acute distress.  Cardiovascular: normal rate Respiratory: normal effort GI: Abd soft, non-tender, gravid appropriate for  gestational age.  MS: Extremities nontender, no edema, normal ROM Neurologic: Alert and oriented x 4.  GU: Neg CVAT.  PELVIC EXAM: Cervix pink, visually closed, without lesion, moderate amount thin white discharge, vaginal walls and external genitalia normal   Dilation: 1 Effacement (%): 50 Cervical Position: Posterior Station: Ballotable Presentation: Vertex Exam by:: Sharen CounterLeftwich-Kirby, Jen Eppinger  FHT:  Baseline 135 , moderate variability, accelerations present, no decelerations Contractions: q 2 mins, mild to moderate to palpation   Labs: Results for orders placed or performed during the hospital encounter of 07/09/17 (from the past 24 hour(s))  Wet prep, genital     Status: Abnormal   Collection Time: 07/09/17 11:23 AM  Result Value Ref Range   Yeast Wet Prep HPF POC NONE SEEN NONE SEEN   Trich, Wet Prep NONE SEEN NONE SEEN   Clue Cells Wet Prep HPF POC NONE SEEN NONE SEEN   WBC, Wet Prep HPF POC MODERATE (A) NONE SEEN   Sperm NONE SEEN   Fetal fibronectin     Status: None   Collection Time: 07/09/17 11:23 AM  Result Value Ref Range   Fetal Fibronectin NEGATIVE NEGATIVE  Urinalysis, Routine w reflex microscopic     Status: Abnormal   Collection Time: 07/09/17  1:53 PM  Result Value Ref Range   Color, Urine STRAW (A) YELLOW   APPearance CLEAR CLEAR   Specific Gravity, Urine 1.010 1.005 - 1.030   pH 7.0 5.0 - 8.0   Glucose, UA NEGATIVE NEGATIVE mg/dL   Hgb urine dipstick NEGATIVE NEGATIVE   Bilirubin Urine NEGATIVE NEGATIVE   Ketones, ur NEGATIVE NEGATIVE mg/dL   Protein, ur NEGATIVE NEGATIVE mg/dL   Nitrite NEGATIVE NEGATIVE   Leukocytes, UA NEGATIVE NEGATIVE   A/Positive/-- (10/01 1416)  Imaging:    MAU Course/MDM: I have ordered labs and reviewed results.  NST reviewed and reactive No evidence of preterm labor today with negative FFN and cervix unchanged in 3 hours in MAU.   Intake BP 130/90 but retake wnl, prior to admin of Procardia IV fluids and Procardia 10 mg  Q 20 minutes x 3 doses given with contractions spacing out to Q 10 - 15 minutes and pt report of improved symptoms Consult Dr Earlene Plateravis with presentation, exam findings and test results.  D/C home, f/u in office this week for lab only visit to complete 28 week labs as scheduled today Pt discharge with strict preterm labor precautions.  Today's evaluation included a work-up for preterm labor which can be life-threatening for both mom and baby.  Assessment: 1. Threatened preterm labor, third trimester     Plan: Discharge home Labor precautions and fetal kick counts Follow-up Information    [facility]   Specialty:  Obstetrics and Gynecology Why:  The office will call or call  to set up your lab only visit this week. Return to MAU as needed for emergencies.  Contact information: [address] [place] [address] [phone]         Allergies as of 07/09/2017      Reactions   Peanuts [peanut Oil] Anaphylaxis, Hives      Medication List    STOP taking these medications   ciprofloxacin 500 MG tablet Commonly known as:  CIPRO   PRENATE PIXIE 10-0.6-0.4-200 MG Caps     TAKE these medications   prenatal multivitamin Tabs tablet Take 1 tablet by mouth every morning.       Sharen Counter Certified Nurse-Midwife 07/09/2017 3:13 PM

## 2017-07-09 NOTE — Progress Notes (Addendum)
G5P3L3 @ 32.[redacted] wksga. Presents to triage for ctx that started at 09 which woke pt up. Denies bleeding or LOF. +FM  provider at bs assessing. GC wetprep and FFN collected.  1110: SVE 1/50. FHR decel noted during exam. Provider aware.   1140: IV started per order.   1148: PO procardia given.  1223: PO procardia given..   1237: up to bathroom 1247: back from bathroom. Monitors reapplied.   1252: 3rd dose 10 mg PO procardia given LR up @ 125  1330: Pt states ctx decreased and feels shorter.   1350: up to bathroom 1353: urine sent  1440: provider made aware of urine.   1455: provider at bs re-assessing pt. Ok to d/c IV. Iv taken out. FHM discontinued.  15: discharge instructions given with pt understanding. Pt left unit via ambulatory with SO.

## 2017-07-10 LAB — GC/CHLAMYDIA PROBE AMP (~~LOC~~) NOT AT ARMC
Chlamydia: NEGATIVE
Neisseria Gonorrhea: NEGATIVE

## 2017-07-16 ENCOUNTER — Ambulatory Visit (HOSPITAL_COMMUNITY)
Admission: RE | Admit: 2017-07-16 | Discharge: 2017-07-16 | Disposition: A | Discharge status: Home / Self Care | Payer: Medicaid Other | Source: Ambulatory Visit | Attending: Certified Nurse Midwife | Admitting: Certified Nurse Midwife

## 2017-07-23 ENCOUNTER — Ambulatory Visit (HOSPITAL_COMMUNITY)
Admission: RE | Admit: 2017-07-23 | Discharge: 2017-07-23 | Disposition: A | Discharge status: Home / Self Care | Payer: Medicaid Other | Source: Ambulatory Visit | Attending: Certified Nurse Midwife | Admitting: Certified Nurse Midwife

## 2017-07-24 ENCOUNTER — Telehealth: Payer: Self-pay | Admitting: Obstetrics and Gynecology

## 2017-07-24 NOTE — Telephone Encounter (Signed)
Patient has missed her last several prenatal appointments, her 2 hour GTT and her ultrasound appointments.  Left message with patient to call me so we could get these appointments rescheduled for her.  Stressed the importance of keeping these appointments.

## 2017-07-30 ENCOUNTER — Ambulatory Visit (HOSPITAL_COMMUNITY)
Admission: RE | Admit: 2017-07-30 | Discharge: 2017-07-30 | Disposition: A | Discharge status: Home / Self Care | Payer: Medicaid Other | Source: Ambulatory Visit | Attending: Certified Nurse Midwife | Admitting: Certified Nurse Midwife

## 2017-08-14 ENCOUNTER — Encounter: Payer: Medicaid Other | Admitting: Obstetrics and Gynecology

## 2017-08-14 ENCOUNTER — Ambulatory Visit (HOSPITAL_COMMUNITY)
Admission: RE | Admit: 2017-08-14 | Discharge: 2017-08-14 | Disposition: A | Discharge status: Home / Self Care | Payer: Medicaid Other | Source: Ambulatory Visit | Attending: Maternal and Fetal Medicine | Admitting: Maternal and Fetal Medicine

## 2017-08-14 ENCOUNTER — Other Ambulatory Visit: Payer: Medicaid Other

## 2017-08-17 ENCOUNTER — Inpatient Hospital Stay (HOSPITAL_COMMUNITY)
Admission: AD | Admit: 2017-08-17 | Discharge: 2017-08-18 | Disposition: A | Discharge status: Home / Self Care | Payer: Medicaid Other | Source: Ambulatory Visit | Attending: Obstetrics & Gynecology | Admitting: Obstetrics & Gynecology

## 2017-08-17 DIAGNOSIS — O479 False labor, unspecified: Secondary | ICD-10-CM | POA: Insufficient documentation

## 2017-08-17 DIAGNOSIS — Z3A38 38 weeks gestation of pregnancy: Secondary | ICD-10-CM | POA: Insufficient documentation

## 2017-08-17 NOTE — MAU Note (Signed)
Urine sent to lab 

## 2017-08-18 DIAGNOSIS — Z3A38 38 weeks gestation of pregnancy: Secondary | ICD-10-CM | POA: Diagnosis not present

## 2017-08-18 DIAGNOSIS — O479 False labor, unspecified: Secondary | ICD-10-CM | POA: Diagnosis not present

## 2017-08-18 NOTE — MAU Note (Signed)
I have communicated with Shelby Olsen  and reviewed vital signs:  Vitals:   08/18/17 0026 08/18/17 0028  BP: 133/69 133/69  Pulse: 99 99  Resp:  18  Temp:    SpO2:  99%    Vaginal exam:  Dilation: 3 Effacement (%): 50 Cervical Position: Middle Station: -3 Presentation: Vertex Exam by:: Yehudah Standing, rn ,   Also reviewed contraction pattern and that non-stress test is reactive.  It has been documented that patient is contracting every 1-3  minutes with no cervical change over 1 hour not indicating active labor.  Patient denies any other complaints.  Based on this report provider has given order for discharge.  A discharge order and diagnosis entered by a provider.   Labor discharge instructions reviewed with patient.

## 2017-08-18 NOTE — MAU Note (Signed)
Pt presents to mau with contractions every 5 mins rating them 8 on a scale of 0-10.  No bleeding or LOF.  +FM

## 2017-08-18 NOTE — Discharge Instructions (Signed)
Braxton Hicks Contractions °Contractions of the uterus can occur throughout pregnancy, but they are not always a sign that you are in labor. You may have practice contractions called Braxton Hicks contractions. These false labor contractions are sometimes confused with true labor. °What are Braxton Hicks contractions? °Braxton Hicks contractions are tightening movements that occur in the muscles of the uterus before labor. Unlike true labor contractions, these contractions do not result in opening (dilation) and thinning of the cervix. Toward the end of pregnancy (32-34 weeks), Braxton Hicks contractions can happen more often and may become stronger. These contractions are sometimes difficult to tell apart from true labor because they can be very uncomfortable. You should not feel embarrassed if you go to the hospital with false labor. °Sometimes, the only way to tell if you are in true labor is for your health care provider to look for changes in the cervix. The health care provider will do a physical exam and may monitor your contractions. If you are not in true labor, the exam should show that your cervix is not dilating and your water has not broken. °If there are other health problems associated with your pregnancy, it is completely safe for you to be sent home with false labor. You may continue to have Braxton Hicks contractions until you go into true labor. °How to tell the difference between true labor and false labor °True labor °· Contractions last 30-70 seconds. °· Contractions become very regular. °· Discomfort is usually felt in the top of the uterus, and it spreads to the lower abdomen and low back. °· Contractions do not go away with walking. °· Contractions usually become more intense and increase in frequency. °· The cervix dilates and gets thinner. °False labor °· Contractions are usually shorter and not as strong as true labor contractions. °· Contractions are usually irregular. °· Contractions  are often felt in the front of the lower abdomen and in the groin. °· Contractions may go away when you walk around or change positions while lying down. °· Contractions get weaker and are shorter-lasting as time goes on. °· The cervix usually does not dilate or become thin. °Follow these instructions at home: °· Take over-the-counter and prescription medicines only as told by your health care provider. °· Keep up with your usual exercises and follow other instructions from your health care provider. °· Eat and drink lightly if you think you are going into labor. °· If Braxton Hicks contractions are making you uncomfortable: °? Change your position from lying down or resting to walking, or change from walking to resting. °? Sit and rest in a tub of warm water. °? Drink enough fluid to keep your urine pale yellow. Dehydration may cause these contractions. °? Do slow and deep breathing several times an hour. °· Keep all follow-up prenatal visits as told by your health care provider. This is important. °Contact a health care provider if: °· You have a fever. °· You have continuous pain in your abdomen. °Get help right away if: °· Your contractions become stronger, more regular, and closer together. °· You have fluid leaking or gushing from your vagina. °· You pass blood-tinged mucus (bloody show). °· You have bleeding from your vagina. °· You have low back pain that you never had before. °· You feel your baby’s head pushing down and causing pelvic pressure. °· Your baby is not moving inside you as much as it used to. °Summary °· Contractions that occur before labor are called Braxton   Hicks contractions, false labor, or practice contractions. °· Braxton Hicks contractions are usually shorter, weaker, farther apart, and less regular than true labor contractions. True labor contractions usually become progressively stronger and regular and they become more frequent. °· Manage discomfort from Braxton Hicks contractions by  changing position, resting in a warm bath, drinking plenty of water, or practicing deep breathing. °This information is not intended to replace advice given to you by your health care provider. Make sure you discuss any questions you have with your health care provider. °Document Released: 12/26/2016 Document Revised: 12/26/2016 Document Reviewed: 12/26/2016 °Elsevier Interactive Patient Education © 2018 [employer] Inc[employer] ° °Fetal Movement Counts °Patient Name: ________________________________________________ Patient Due Date: ____________________ °What is a fetal movement count? °A fetal movement count is the number of times that you feel your baby move during a certain amount of time. This may also be called a fetal kick count. A fetal movement count is recommended for every pregnant woman. You may be asked to start counting fetal movements as early as week 28 of your pregnancy. °Pay attention to when your baby is most active. You may notice your baby's sleep and wake cycles. You may also notice things that make your baby move more. You should do a fetal movement count: °· When your baby is normally most active. °· At the same time each day. ° °A good time to count movements is while you are resting, after having something to eat and drink. °How do I count fetal movements? °1. Find a quiet, comfortable area. Sit, or lie down on your side. °2. Write down the date, the start time and stop time, and the number of movements that you felt between those two times. Take this information with you to your health care visits. °3. For 2 hours, count kicks, flutters, swishes, rolls, and jabs. You should feel at least 10 movements during 2 hours. °4. You may stop counting after you have felt 10 movements. °5. If you do not feel 10 movements in 2 hours, have something to eat and drink. Then, keep resting and counting for 1 hour. If you feel at least 4 movements during that hour, you may stop counting. °Contact a health care  provider if: °· You feel fewer than 4 movements in 2 hours. °· Your baby is not moving like he or she usually does. °Date: ____________ Start time: ____________ Stop time: ____________ Movements: ____________ °Date: ____________ Start time: ____________ Stop time: ____________ Movements: ____________ °Date: ____________ Start time: ____________ Stop time: ____________ Movements: ____________ °Date: ____________ Start time: ____________ Stop time: ____________ Movements: ____________ °Date: ____________ Start time: ____________ Stop time: ____________ Movements: ____________ °Date: ____________ Start time: ____________ Stop time: ____________ Movements: ____________ °Date: ____________ Start time: ____________ Stop time: ____________ Movements: ____________ °Date: ____________ Start time: ____________ Stop time: ____________ Movements: ____________ °Date: ____________ Start time: ____________ Stop time: ____________ Movements: ____________ °This information is not intended to replace advice given to you by your health care provider. Make sure you discuss any questions you have with your health care provider. °Document Released: 09/11/2006 Document Revised: 04/10/2016 Document Reviewed: 09/21/2015 °Elsevier Interactive Patient Education © 2018 Elsevier Inc. ° °

## 2017-08-22 ENCOUNTER — Encounter (HOSPITAL_COMMUNITY): Payer: Self-pay | Admitting: *Deleted

## 2017-08-22 ENCOUNTER — Inpatient Hospital Stay (HOSPITAL_COMMUNITY)
Admission: AD | Admit: 2017-08-22 | Discharge: 2017-08-22 | Disposition: A | Discharge status: Home / Self Care | Payer: Medicaid Other | Source: Ambulatory Visit | Attending: Family Medicine | Admitting: Family Medicine

## 2017-08-22 DIAGNOSIS — O479 False labor, unspecified: Secondary | ICD-10-CM | POA: Diagnosis not present

## 2017-08-22 DIAGNOSIS — Z3A00 Weeks of gestation of pregnancy not specified: Secondary | ICD-10-CM | POA: Diagnosis not present

## 2017-08-22 NOTE — MAU Note (Signed)
PT SAYS SHE HAD BLOODY SHOW  THIS  AM.   PNC - WITH FAMINA -  VE IN MAU    MON- 4 CM  DENIES HSV AND MRSA .   GBS- UNSURE

## 2017-08-22 NOTE — Discharge Instructions (Signed)

## 2017-08-22 NOTE — MAU Note (Signed)
I have communicated with F. Cresenzo Dishmon CNM and reviewed vital signs:  Vitals:   08/22/17 0648  BP: 127/83  Pulse: (!) 102  Resp: 18  Temp: 98.7 F (37.1 C)    Vaginal exam:  Dilation: 3 Effacement (%): 50 Station: Ballotable Presentation: Vertex Exam by:: BRIGETTE, RN ,   Also reviewed contraction pattern and that non-stress test is reactive.  It has been documented that patient is contracting irregularly with no cervical change since her last exam 2 days ago, not indicating active labor.  Patient denies any other complaints.  Based on this report provider has given order for discharge.  A discharge order and diagnosis entered by a provider.   Labor discharge instructions reviewed with patient.

## 2017-08-26 NOTE — L&D Delivery Note (Signed)
Delivery Note Pt labored precipitously and became complete at 0327. She had SROM for clear fluid during exam, and at 3:30 AM a viable female was delivered via Vaginal, Spontaneous (Presentation: LOA).  APGAR: 9, 9; weight: pending. Infant dried and lifted to pt's abd; cord clamped and cut by CNM. Hospital cord blood sample collected.   Placenta status: spont , intact .  Cord: 3 vessel  Anesthesia:  None Episiotomy: None Lacerations: None Est. Blood Loss (mL): 150  Mom to postpartum.  Baby to Couplet care / Skin to Skin.  Cam HaiSHAW, KIMBERLY CNM 08/27/2017, 3:53 AM  Please schedule this patient for Postpartum visit in: 4 weeks with the following provider: Any provider For C/S patients schedule nurse incision check in weeks 2 weeks: no Low risk pregnancy complicated by: only 2 prenatal visits; hx IUFD at term Delivery mode:  SVD Anticipated Birth Control:  Nexplanon PP Procedures needed: none  Schedule Integrated BH visit: no

## 2017-08-27 ENCOUNTER — Inpatient Hospital Stay (HOSPITAL_COMMUNITY)
Admission: AD | Admit: 2017-08-27 | Discharge: 2017-08-29 | DRG: 807 | Disposition: A | Discharge status: Home / Self Care | Payer: Medicaid Other | Source: Ambulatory Visit | Attending: Family Medicine | Admitting: Family Medicine

## 2017-08-27 ENCOUNTER — Encounter (HOSPITAL_COMMUNITY)
Admission: AD | Disposition: A | Discharge status: Home / Self Care | Payer: Self-pay | Source: Ambulatory Visit | Attending: Family Medicine

## 2017-08-27 ENCOUNTER — Encounter (HOSPITAL_COMMUNITY): Payer: Self-pay

## 2017-08-27 ENCOUNTER — Other Ambulatory Visit: Payer: Self-pay

## 2017-08-27 DIAGNOSIS — Z3483 Encounter for supervision of other normal pregnancy, third trimester: Secondary | ICD-10-CM | POA: Diagnosis present

## 2017-08-27 DIAGNOSIS — Z3A39 39 weeks gestation of pregnancy: Secondary | ICD-10-CM

## 2017-08-27 LAB — CBC
HCT: 35.7 % — ABNORMAL LOW (ref 36.0–46.0)
Hemoglobin: 12.1 g/dL (ref 12.0–15.0)
MCH: 28.3 pg (ref 26.0–34.0)
MCHC: 33.9 g/dL (ref 30.0–36.0)
MCV: 83.6 fL (ref 78.0–100.0)
PLATELETS: 185 10*3/uL (ref 150–400)
RBC: 4.27 MIL/uL (ref 3.87–5.11)
RDW: 13.4 % (ref 11.5–15.5)
WBC: 9.8 10*3/uL (ref 4.0–10.5)

## 2017-08-27 LAB — RAPID URINE DRUG SCREEN, HOSP PERFORMED
Amphetamines: NOT DETECTED
Barbiturates: NOT DETECTED
Benzodiazepines: NOT DETECTED
Cocaine: NOT DETECTED
Opiates: NOT DETECTED
Tetrahydrocannabinol: NOT DETECTED

## 2017-08-27 LAB — TYPE AND SCREEN
ABO/RH(D): A POS
ANTIBODY SCREEN: NEGATIVE

## 2017-08-27 LAB — RPR: RPR Ser Ql: NONREACTIVE

## 2017-08-27 SURGERY — LIGATION, FALLOPIAN TUBE, POSTPARTUM
Anesthesia: Choice

## 2017-08-27 MED ORDER — COCONUT OIL OIL: 1.0000 "application " | TOPICAL_OIL | Status: DC | PRN

## 2017-08-27 MED ORDER — SENNOSIDES-DOCUSATE SODIUM 8.6-50 MG PO TABS
2.0000 | ORAL_TABLET | ORAL | Status: DC
  Administered 2017-08-27 – 2017-08-28 (×2): 2 via ORAL
  Filled 2017-08-27 (×2): qty 2

## 2017-08-27 MED ORDER — ACETAMINOPHEN 325 MG PO TABS: 650.0000 mg | ORAL_TABLET | ORAL | Status: DC | PRN

## 2017-08-27 MED ORDER — SOD CITRATE-CITRIC ACID 500-334 MG/5ML PO SOLN: 30.0000 mL | ORAL | Status: DC | PRN

## 2017-08-27 MED ORDER — WITCH HAZEL-GLYCERIN EX PADS: 1.0000 "application " | MEDICATED_PAD | CUTANEOUS | Status: DC | PRN

## 2017-08-27 MED ORDER — BENZOCAINE-MENTHOL 20-0.5 % EX AERO: 1.0000 "application " | INHALATION_SPRAY | CUTANEOUS | Status: DC | PRN

## 2017-08-27 MED ORDER — IBUPROFEN 600 MG PO TABS
600.0000 mg | ORAL_TABLET | Freq: Four times a day (QID) | ORAL | Status: DC
  Administered 2017-08-27 – 2017-08-29 (×9): 600 mg via ORAL
  Filled 2017-08-27 (×9): qty 1

## 2017-08-27 MED ORDER — LACTATED RINGERS IV SOLN: 500.0000 mL | INTRAVENOUS | Status: DC | PRN

## 2017-08-27 MED ORDER — ACETAMINOPHEN 325 MG PO TABS
650.0000 mg | ORAL_TABLET | ORAL | Status: DC | PRN
  Administered 2017-08-27 – 2017-08-29 (×8): 650 mg via ORAL
  Filled 2017-08-27 (×9): qty 2

## 2017-08-27 MED ORDER — DIPHENHYDRAMINE HCL 25 MG PO CAPS: 25.0000 mg | ORAL_CAPSULE | Freq: Four times a day (QID) | ORAL | Status: DC | PRN

## 2017-08-27 MED ORDER — OXYCODONE-ACETAMINOPHEN 5-325 MG PO TABS: 2.0000 | ORAL_TABLET | ORAL | Status: DC | PRN

## 2017-08-27 MED ORDER — DIBUCAINE 1 % RE OINT: 1.0000 "application " | TOPICAL_OINTMENT | RECTAL | Status: DC | PRN

## 2017-08-27 MED ORDER — TETANUS-DIPHTH-ACELL PERTUSSIS 5-2.5-18.5 LF-MCG/0.5 IM SUSP: 0.5000 mL | Freq: Once | INTRAMUSCULAR | Status: DC

## 2017-08-27 MED ORDER — ZOLPIDEM TARTRATE 5 MG PO TABS: 5.0000 mg | ORAL_TABLET | Freq: Every evening | ORAL | Status: DC | PRN

## 2017-08-27 MED ORDER — OXYCODONE-ACETAMINOPHEN 5-325 MG PO TABS
1.0000 | ORAL_TABLET | ORAL | Status: DC | PRN
  Administered 2017-08-27: 1 via ORAL
  Filled 2017-08-27: qty 1

## 2017-08-27 MED ORDER — OXYCODONE HCL 5 MG PO TABS
5.0000 mg | ORAL_TABLET | ORAL | Status: DC | PRN
  Administered 2017-08-27 – 2017-08-29 (×9): 5 mg via ORAL
  Filled 2017-08-27 (×10): qty 1

## 2017-08-27 MED ORDER — LIDOCAINE HCL (PF) 1 % IJ SOLN
30.0000 mL | INTRAMUSCULAR | Status: DC | PRN
  Filled 2017-08-27: qty 30

## 2017-08-27 MED ORDER — ONDANSETRON HCL 4 MG/2ML IJ SOLN: 4.0000 mg | INTRAMUSCULAR | Status: DC | PRN

## 2017-08-27 MED ORDER — OXYTOCIN BOLUS FROM INFUSION
500.0000 mL | Freq: Once | INTRAVENOUS | Status: AC
  Administered 2017-08-27: 500 mL via INTRAVENOUS

## 2017-08-27 MED ORDER — FENTANYL CITRATE (PF) 100 MCG/2ML IJ SOLN: 100.0000 ug | INTRAMUSCULAR | Status: DC | PRN

## 2017-08-27 MED ORDER — OXYTOCIN 40 UNITS IN LACTATED RINGERS INFUSION - SIMPLE MED
2.5000 [IU]/h | INTRAVENOUS | Status: DC
  Filled 2017-08-27: qty 1000

## 2017-08-27 MED ORDER — LACTATED RINGERS IV SOLN
INTRAVENOUS | Status: DC
  Administered 2017-08-27: 03:00:00 via INTRAVENOUS

## 2017-08-27 MED ORDER — SIMETHICONE 80 MG PO CHEW: 80.0000 mg | CHEWABLE_TABLET | ORAL | Status: DC | PRN

## 2017-08-27 MED ORDER — PRENATAL MULTIVITAMIN CH
1.0000 | ORAL_TABLET | Freq: Every day | ORAL | Status: DC
  Administered 2017-08-27 – 2017-08-28 (×2): 1 via ORAL
  Filled 2017-08-27 (×2): qty 1

## 2017-08-27 MED ORDER — ONDANSETRON HCL 4 MG/2ML IJ SOLN: 4.0000 mg | Freq: Four times a day (QID) | INTRAMUSCULAR | Status: DC | PRN

## 2017-08-27 MED ORDER — ONDANSETRON HCL 4 MG PO TABS: 4.0000 mg | ORAL_TABLET | ORAL | Status: DC | PRN

## 2017-08-27 NOTE — Plan of Care (Signed)
Patient ambulating and voiding well. Royston CowperIsley, Kailee Essman E, RN

## 2017-08-27 NOTE — H&P (Signed)
Shelby Olsen is a 22 y.o. female [phone] @ 39.1wks by 6wk scan presenting for active labor. Denies leaking or bldg. She has [facility] 2 [facility] and her preg has been remarkable for 1) hx term IUFD in 2014 2) GBS unk  OB History    Gravida Para Term Preterm AB Living   5 4 4  0 0 3   SAB TAB Ectopic Multiple Live Births   0 0 0 0 3     Past Medical History:  Diagnosis Date  . Hx of measles   . IUFD (intrauterine fetal death) - in previous pregnancy 12/06/2014  . Migraine    Past Surgical History:  Procedure Laterality Date  . NO PAST SURGERIES     Family History: family history includes Asthma in her brother and sister; Birth defects in her brother; Cancer in her maternal grandmother; Hypertension in her mother; Learning disabilities in her brother and sister; Mental retardation in her brother. Social History:  reports that  has never smoked. she has never used smokeless tobacco. She reports that she does not drink alcohol or use drugs.     Maternal Diabetes: No - unknown Genetic Screening: Normal Maternal Ultrasounds/Referrals: Normal Fetal Ultrasounds or other Referrals:  None Maternal Substance Abuse:  No UDS pend Significant Maternal Medications:  None Significant Maternal Lab Results:  Lab values include: Other:  GBS unknown Other Comments:  prenatal visits x 2  ROS History   Blood pressure (!) 138/96, pulse (!) 105, last menstrual period 11/18/2016, unknown if currently breastfeeding. Exam Physical Exam  Constitutional: She is oriented to person, place, and time. She appears well-developed.  HENT:  Head: Normocephalic.  Neck: Normal range of motion.  Cardiovascular: Normal rate.  Respiratory: Effort normal.  GI:  EFM 130s, +accels, occ variables, +LTV Ctx q 2-4 mins  Genitourinary:  Genitourinary Comments: Per MAU RN- BBOW and vtx felt, ?cx  Musculoskeletal: Normal range of motion.  Neurological: She is alert and oriented to person, place, and  time.  Skin: Skin is warm and dry.  Psychiatric: She has a normal mood and affect. Her behavior is normal. Thought content normal.    Prenatal labs: ABO, Rh: A/Positive/-- (10/01 1416) Antibody: Negative (10/01 1416) Rubella: 4.36 (10/01 1416) RPR: Non Reactive (10/01 1416)  HBsAg: Negative (10/01 1416)  HIV: Non Reactive (10/01 1416)  GBS:   unknown  Assessment/Plan: IUP@term  Active labor GBS unknown  [facility] management Anticipate SVD Will get UDS and SW consult pp   Cam HaiSHAW, KIMBERLY CNM 08/27/2017, 2:43 AM

## 2017-08-27 NOTE — MAU Note (Signed)
Pt states that she started having ctx's at 2030. Pt rates the pain 10/10 intermittent.

## 2017-08-27 NOTE — Lactation Note (Signed)
This note was copied from a baby's chart. Lactation Consultation Note  Patient Name: Boy Vikki Portslizabeth Lightner ZOXWR'UToday's Date: 08/27/2017 Reason for consult: Initial assessment   Baby is out of room for hearing screen. P4, Baby is 10 hours old and has not breastfed since 0900.  Ex BF for 2 yr, 1 yr and 8 mos. Denies questions or concerns. Suggest when baby returns to wake baby for feeding. Discussed hand expressing and spoon feeding. Mom encouraged to feed baby 8-12 times/24 hours and with feeding cues.  Discussed bascis. Mom made aware of O/P services, breastfeeding support groups, community resources, and our phone # for post-discharge questions.     Maternal Data Has patient been taught Hand Expression?: Yes Does the patient have breastfeeding experience prior to this delivery?: Yes  Feeding    LATCH Score                   Interventions    Lactation Tools Discussed/Used     Consult Status Consult Status: Follow-up Date: 08/28/17 Follow-up type: In-patient    Dahlia ByesBerkelhammer, Bricyn Labrada [facility] 08/27/2017, 1:30 PM

## 2017-08-28 NOTE — Progress Notes (Signed)
Mom is resting when RN goes in and out of room with infant sleeping in crib.Mom rates pain 7/10-10/10 regardless of pain medication.  RN is alternating medications and providing heat packs.  Mom declines needing anything else. Mom appears comfortable. Will continue to monitor. Royston CowperIsley, Kainoa Swoboda E, RN

## 2017-08-28 NOTE — Clinical Social Work Maternal (Signed)
CLINICAL SOCIAL WORK MATERNAL/CHILD NOTE  Patient Details  Name: Shelby Olsen MRN: [id number] Date of Birth: 1995/12/03  Date:  08/28/2017  Clinical Social Worker Initiating Note:  Terri Piedra, [place] Date/Time: Initiated:  08/28/17/1242     Child's Name:  Shelby Olsen.   Biological Parents:  Mother, Father(Deziree Chowning and Carlota Raspberry)   Need for Interpreter:  None   Reason for Referral:  Late or No Prenatal Care (2 PNVs)   Address:  [place] [address]    Phone number:  [phone] (home)     Additional phone number:   Household Members/Support Persons (HM/SP):   Household Member/Support Person 1, Household Member/Support Person 2, Household Member/Support Person 3, Household Member/Support Person 4, Household Member/Support Person 5(MOB has 3 sons at home, ages 50, 51, and 45.  FOB has a 24 year old who spends part of the time in the home.)   HM/SP Name Relationship DOB or Age  HM/SP -1 DeJuan Austin SO/FOB    HM/SP -2        HM/SP -3        HM/SP -4        HM/SP -5        HM/SP -6        HM/SP -7        HM/SP -8          Natural Supports (not living in the home):  Immediate Family(MOB reports that [facility] is her greatest support in addition to FOB.)   Professional Supports: None   Employment:     Type of Work:     Education:      Homebound arranged:    Museum/gallery curator Resources:  Medicaid   Other Resources:      Cultural/Religious Considerations Which May Impact Care: None stated.  Strengths:  Ability to meet basic needs , Home prepared for child , Pediatrician chosen   Psychotropic Medications:         Pediatrician:    [place]  Pediatrician List:   [facility]  [place]      Pediatrician Fax Number:    Risk Factors/Current Problems:  None   Cognitive State:  Able to Concentrate , Alert , Linear  Thinking , Insightful , Goal Oriented    Mood/Affect:  Euthymic , Calm , Comfortable , Interested    CSW Assessment: CSW met with MOB in her first floor room to offer support and complete assessment due to limited [facility].  MOB was pleasant and receptive to visit.  She reports she and baby are doing well.  She admits that she was hoping for a girl, but is "fine now" and appears to be bonding well with baby.  She reports a great relationship with FOB and a great support system.  She states this is her first child with FOB and that now they have 5 sons between them.   MOB states she "found out late" about this pregnancy and that it took a long time to get Medicaid and then to get a doctor's appointment once getting Medicaid.  She wanted to go to [place] but found out that they were no longer taking Medicaid.  She states she missed a few appointments and then seemed to always be at the hospital when her appointments were scheduled.  She reports no substance use and was understanding of  hospital drug screen policy.  CSW Plan/Description:  No Further Intervention Required/No Barriers to Discharge, Sudden Infant Death Syndrome (SIDS) Education, Perinatal Mood and Anxiety Disorder (PMADs) Education, [place], CSW Will Continue to Monitor Umbilical Cord Tissue Drug Screen Results and Make Report if Purity, [place] 08/28/2017, 12:45 PM

## 2017-08-28 NOTE — Progress Notes (Signed)
Post Partum Day 1 Subjective: no complaints, up ad lib, voiding, tolerating PO and + flatus  Objective: Blood pressure 111/73, pulse 89, temperature 98 F (36.7 C), temperature source Oral, resp. rate 18, height 5\' 2"  (1.575 m), weight 191 lb 12.8 oz (87 kg), last menstrual period 11/18/2016, unknown if currently breastfeeding.  Physical Exam:  General: alert, cooperative and no distress Lochia: appropriate Uterine Fundus: firm Incision: n/a DVT Evaluation: No evidence of DVT seen on physical exam. Negative Homan's sign. No cords or calf tenderness. No significant calf/ankle edema.  Recent Labs    08/27/17 0259  HGB 12.1  HCT 35.7*    Assessment/Plan: Plan for discharge tomorrow, Breastfeeding and Contraception BTL vs LARC   LOS: 1 day   Sharen CounterLisa Leftwich-Kirby 08/28/2017, 1:22 PM

## 2017-08-28 NOTE — Plan of Care (Signed)
Patient ambulating well, voiding, VSS, and taking care of infant appropriately. Royston CowperIsley, Kalaya Infantino E, RN

## 2017-08-28 NOTE — Lactation Note (Signed)
This note was copied from a baby's chart. Lactation Consultation Note  Patient Name: Shelby Olsen NWGNF'AToday's Date: 08/28/2017 Reason for consult: Follow-up assessment Mom reports baby is latching to both breasts but she is supplementing with formula because he is still hungry.  Instructed to always put baby to breast first prior to supplementing. Encouraged to call with concerns/assist prn.  Maternal Data    Feeding Feeding Type: Bottle Fed - Formula Nipple Type: Slow - flow  LATCH Score Latch: (asked mom to call with next latch )                 Interventions    Lactation Tools Discussed/Used     Consult Status Consult Status: Follow-up Date: 08/29/17 Follow-up type: In-patient    Huston FoleyMOULDEN, Jim Philemon S 08/28/2017, 1:18 PM

## 2017-08-29 MED ORDER — SENNOSIDES-DOCUSATE SODIUM 8.6-50 MG PO TABS: 2.0000 | ORAL_TABLET | Freq: Every evening | ORAL | 0 refills | Status: DC | PRN

## 2017-08-29 MED ORDER — IBUPROFEN 600 MG PO TABS: 600.0000 mg | ORAL_TABLET | Freq: Four times a day (QID) | ORAL | 0 refills | Status: AC

## 2017-08-29 NOTE — Discharge Summary (Signed)
OB Discharge Summary     Patient Name: Shelby Olsen DOB: 02/07/1996 MRN: [id number]  Date of admission: 08/27/2017 Delivering MD: Cam Hai D   Date of discharge: 08/29/2017  Admitting diagnosis: 40 WEEKS CTX Intrauterine pregnancy: [redacted]w[redacted]d     Secondary diagnosis:  Active Problems:   Normal labor  Additional problems:  Patient Active Problem List   Diagnosis Date Noted  . Normal labor 08/27/2017  . UTI (urinary tract infection) during pregnancy, second trimester 04/09/2017  . Supervision of normal pregnancy, antepartum 04/02/2017  . History of migraine headaches - no meds 12/07/2014  . Short stature 12/07/2014  . History of IUFD 12/06/2014  . BMI 39.0-39.9,adult 12/06/2014  . Late prenatal care 12/06/2014       Discharge diagnosis: Term Pregnancy Delivered                                                                                                Post partum procedures:none  Augmentation: none  Complications: None  Hospital course:  Onset of Labor With Vaginal Delivery     22 y.o. yo [id number] at [redacted]w[redacted]d was admitted in Active Labor on 08/27/2017. Patient had an uncomplicated labor course as follows:  Membrane Rupture Time/Date: 3:27 AM ,08/27/2017   Intrapartum Procedures: Episiotomy: None [1]                                         Lacerations:  None [1]  Patient had a delivery of a Viable infant. 08/27/2017  Information for the patient's newborn:  Telena, Peyser [[id number]]  Delivery Method: Vaginal, Spontaneous(Filed from Delivery Summary)    Pateint had an uncomplicated postpartum course.  She is ambulating, tolerating a regular diet, passing flatus, and urinating well. Patient is discharged home in stable condition on 08/29/17.   Physical exam  Vitals:   08/28/17 0531 08/28/17 1855 08/29/17 0541 08/29/17 0546  BP: 111/73 132/75 120/74   Pulse: 89 98 88   Resp:  17    Temp: 98 F (36.7 C) 98.7 F (37.1 C) 97.9 F (36.6 C)   TempSrc: Oral Oral     SpO2:  100%    Weight:    88.9 kg (196 lb)  Height:       General: alert, cooperative and no distress Lochia: appropriate Uterine Fundus: firm DVT Evaluation: No evidence of DVT seen on physical exam. No cords or calf tenderness. No significant calf/ankle edema. Labs: Lab Results  Component Value Date   WBC 9.8 08/27/2017   HGB 12.1 08/27/2017   HCT 35.7 (L) 08/27/2017   MCV 83.6 08/27/2017   PLT 185 08/27/2017   CMP Latest Ref Rng & Units 08/14/2015  Glucose 65 - 99 mg/dL 80  BUN 6 - 20 mg/dL 9  Creatinine 1.30 - 8.65 mg/dL 7.84  Sodium 696 - 295 mmol/L 136  Potassium 3.5 - 5.1 mmol/L 3.7  Chloride 101 - 111 mmol/L 106  CO2 22 - 32 mmol/L 18(L)  Calcium 8.9 - 10.3 mg/dL 9.6  Total Protein 6.5 - 8.1 g/dL 7.5  Total Bilirubin 0.3 - 1.2 mg/dL 1.6(X0.2(L)  Alkaline Phos 38 - 126 U/L 70  AST 15 - 41 U/L 15  ALT 14 - 54 U/L 17    Discharge instruction: per After Visit Summary and "Baby and Me Booklet".  After visit meds:  Allergies as of 08/29/2017      Reactions   Peanuts [peanut Oil] Anaphylaxis, Hives      Medication List    TAKE these medications   ibuprofen 600 MG tablet Commonly known as:  ADVIL,MOTRIN Take 1 tablet (600 mg total) by mouth every 6 (six) hours.   prenatal multivitamin Tabs tablet Take 1 tablet by mouth every morning.   senna-docusate 8.6-50 MG tablet Commonly known as:  Senokot-S Take 2 tablets by mouth at bedtime as needed for mild constipation.       Diet: routine diet  Activity: Advance as tolerated. Pelvic rest for 6 weeks.   Outpatient follow up:4 weeks Follow up Appt: Future Appointments  Date Time Provider Department Center  09/24/2017  9:00 AM Roe Coombsenney, Rachelle A, CNM [facility] None    Postpartum contraception: Nexplanon  Newborn Data: Live born female  Birth Weight: 6 lb 8.1 oz (2950 g) APGAR: 9, 9  Newborn Delivery   Birth date/time:  08/27/2017 03:30:00 Delivery type:  Vaginal, Spontaneous     Baby Feeding:  Bottle Disposition:home with mother   08/29/2017 Oralia ManisSherin Abraham, DO  OB FELLOW DISCHARGE ATTESTATION  I have seen and examined this patient. I agree with above documentation and have made edits as needed.   Caryl AdaJazma Kayston Jodoin, DO OB Fellow 1:00 PM

## 2017-08-29 NOTE — Progress Notes (Signed)
Patient ID: Shelby Olsen, female   DOB: 05/05/1996, 22 y.o.   MRN: [id number]  Post Partum Day 2 Subjective: no complaints, up ad lib, voiding, tolerating PO and + flatus Met with lactation, using breast and bottle but starting with breast SW consult: "no barriers to discharge". Decided on Mirena for contraception.  Objective: Vitals:   08/28/17 1855 08/29/17 0541  BP: 132/75 120/74  Pulse: 98 88  Resp: 17   Temp: 98.7 F (37.1 C) 97.9 F (36.6 C)  SpO2: 100%     height '5\' 2"'$  (1.575 m), weight 191 lb 12.8 oz (87 kg), last menstrual period 11/18/2016, unknown if currently breastfeeding.  Physical Exam:  General: alert, cooperative and no distress Lochia: appropriate Uterine Fundus: firm Incision: n/a DVT Evaluation: No evidence of DVT seen on physical exam. Negative Homan's sign. No cords or calf tenderness. No significant calf/ankle edema.  RecentLabs(last2labs)  Recent Labs    08/27/17 0259  HGB 12.1  HCT 35.7*      Assessment/Plan: Plan for d/c today, Mirena with PCP.  LOS: 2 day   Willa Frater, MS3 08/29/2017, 0602 AM   OB FELLOW POSTPARTUM PROGRESS NOTE ATTESTATION  I confirm that I have verified the information documented in the medical student's note and that I have also personally reperformed the physical exam and all medical decision making activities. -see discharge summary   Luiz Blare, DO OB Fellow

## 2017-08-29 NOTE — Discharge Instructions (Signed)

## 2017-09-24 ENCOUNTER — Inpatient Hospital Stay (HOSPITAL_COMMUNITY)
Admission: AD | Admit: 2017-09-24 | Discharge: 2017-09-25 | DRG: 776 | Disposition: A | Discharge status: Home / Self Care | Payer: Medicaid Other | Source: Ambulatory Visit | Attending: Obstetrics and Gynecology | Admitting: Obstetrics and Gynecology

## 2017-09-24 ENCOUNTER — Encounter: Payer: Self-pay | Admitting: Certified Nurse Midwife

## 2017-09-24 ENCOUNTER — Other Ambulatory Visit: Payer: Self-pay

## 2017-09-24 ENCOUNTER — Ambulatory Visit (INDEPENDENT_AMBULATORY_CARE_PROVIDER_SITE_OTHER): Payer: Medicaid Other | Admitting: Certified Nurse Midwife

## 2017-09-24 ENCOUNTER — Encounter (HOSPITAL_COMMUNITY): Payer: Self-pay

## 2017-09-24 DIAGNOSIS — Z1389 Encounter for screening for other disorder: Secondary | ICD-10-CM

## 2017-09-24 DIAGNOSIS — O1495 Unspecified pre-eclampsia, complicating the puerperium: Secondary | ICD-10-CM | POA: Diagnosis present

## 2017-09-24 LAB — COMPREHENSIVE METABOLIC PANEL
ALT: 14 U/L (ref 14–54)
AST: 15 U/L (ref 15–41)
Albumin: 4.1 g/dL (ref 3.5–5.0)
Alkaline Phosphatase: 86 U/L (ref 38–126)
Anion gap: 8 (ref 5–15)
BUN: 10 mg/dL (ref 6–20)
CO2: 21 mmol/L — ABNORMAL LOW (ref 22–32)
Calcium: 9 mg/dL (ref 8.9–10.3)
Chloride: 110 mmol/L (ref 101–111)
Creatinine, Ser: 0.66 mg/dL (ref 0.44–1.00)
GFR calc Af Amer: >60 mL/min (ref >60)
GFR calc non Af Amer: >60 mL/min (ref >60)
Glucose, Bld: 87 mg/dL (ref 65–99)
Potassium: 3.6 mmol/L (ref 3.5–5.1)
Sodium: 139 mmol/L (ref 135–145)
Total Bilirubin: 0.3 mg/dL (ref 0.3–1.2)
Total Protein: 7.4 g/dL (ref 6.5–8.1)

## 2017-09-24 LAB — CBC
HCT: 40.7 % (ref 36.0–46.0)
Hemoglobin: 13.6 g/dL (ref 12.0–15.0)
MCH: 27.9 pg (ref 26.0–34.0)
MCHC: 33.4 g/dL (ref 30.0–36.0)
MCV: 83.4 fL (ref 78.0–100.0)
Platelets: 228 10*3/uL (ref 150–400)
RBC: 4.88 MIL/uL (ref 3.87–5.11)
RDW: 12.4 % (ref 11.5–15.5)
WBC: 5.5 10*3/uL (ref 4.0–10.5)

## 2017-09-24 LAB — PROTEIN / CREATININE RATIO, URINE
Creatinine, Urine: 97 mg/dL
Total Protein, Urine: <6 mg/dL

## 2017-09-24 MED ORDER — AMLODIPINE BESYLATE 10 MG PO TABS
10.0000 mg | ORAL_TABLET | Freq: Every day | ORAL | Status: DC
  Administered 2017-09-24 – 2017-09-25 (×2): 10 mg via ORAL
  Filled 2017-09-24 (×3): qty 1

## 2017-09-24 MED ORDER — LACTATED RINGERS IV SOLN
INTRAVENOUS | Status: DC
  Administered 2017-09-24 – 2017-09-25 (×2): 100 mL/h via INTRAVENOUS

## 2017-09-24 MED ORDER — HYDRALAZINE HCL 20 MG/ML IJ SOLN
10.0000 mg | Freq: Once | INTRAMUSCULAR | Status: AC | PRN
  Administered 2017-09-24: 10 mg via INTRAVENOUS
  Filled 2017-09-24: qty 1

## 2017-09-24 MED ORDER — MAGNESIUM SULFATE 40 G IN LACTATED RINGERS - SIMPLE
2.0000 g/h | INTRAVENOUS | Status: AC
  Administered 2017-09-24 – 2017-09-25 (×2): 2 g/h via INTRAVENOUS
  Filled 2017-09-24 (×2): qty 40

## 2017-09-24 MED ORDER — HYDRALAZINE HCL 20 MG/ML IJ SOLN: 5.0000 mg | INTRAMUSCULAR | Status: DC | PRN

## 2017-09-24 MED ORDER — MAGNESIUM SULFATE BOLUS VIA INFUSION
6.0000 g | Freq: Once | INTRAVENOUS | Status: AC
  Administered 2017-09-24: 6 g via INTRAVENOUS
  Filled 2017-09-24: qty 500

## 2017-09-24 MED ORDER — LABETALOL HCL 5 MG/ML IV SOLN: 20.0000 mg | INTRAVENOUS | Status: DC | PRN

## 2017-09-24 MED ORDER — LABETALOL HCL 5 MG/ML IV SOLN
20.0000 mg | INTRAVENOUS | Status: AC | PRN
  Administered 2017-09-24: 40 mg via INTRAVENOUS
  Administered 2017-09-24: 80 mg via INTRAVENOUS
  Administered 2017-09-24: 20 mg via INTRAVENOUS
  Filled 2017-09-24: qty 4
  Filled 2017-09-24: qty 16
  Filled 2017-09-24: qty 8

## 2017-09-24 MED ORDER — ACETAMINOPHEN 500 MG PO TABS
1000.0000 mg | ORAL_TABLET | Freq: Four times a day (QID) | ORAL | Status: DC | PRN
  Administered 2017-09-24 – 2017-09-25 (×2): 1000 mg via ORAL
  Filled 2017-09-24 (×2): qty 2

## 2017-09-24 NOTE — H&P (Signed)
SUBJECTIVE  HPI: Shelby Olsen is a 22 y.o. [id number] 4 weeks PP who presents to maternity admissions sent from the office reporting hypertension. She denies history of hypertension prior to pregnancy or during. Has never received medication for hypertension antenatal or intrapartum. She reports that her BP at the office this morning around 0900 was elevated. Today in the office her BP was 170/100 then 166/116. She denies any s/s of preeclampsia. Denies HA, vision changes, epigastric pain, increased swelling. She denies vaginal bleeding, vaginal itching/burning, urinary symptoms, dizziness, n/v, or fever/chills. She plans Nexplanon for birth control.      Past Medical History:  Diagnosis Date  . Hx of measles   . IUFD (intrauterine fetal death) - in previous pregnancy 12/06/2014  . Migraine         Past Surgical History:  Procedure Laterality Date  . NO PAST SURGERIES     Social History        Socioeconomic History  . Marital status: Single    Spouse name: Not on file  . Number of children: Not on file  . Years of education: Not on file  . Highest education level: Not on file  Social Needs  . Financial resource strain: Not on file  . Food insecurity - worry: Not on file  . Food insecurity - inability: Not on file  . Transportation needs - medical: Not on file  . Transportation needs - non-medical: Not on file  Occupational History  . Not on file  Tobacco Use  . Smoking status: Never Smoker  . Smokeless tobacco: Never Used  Substance and Sexual Activity  . Alcohol use: No  . Drug use: No    Comment: around people who smokes marijuana. SO smells of it.   . Sexual activity: Not Currently    Birth control/protection: None  Other Topics Concern  . Not on file  Social History Narrative  . Not on file   No current facility-administered medications on file prior to encounter.          Current Outpatient Medications on File Prior to Encounter  Medication Sig Dispense Refill   . ibuprofen (ADVIL,MOTRIN) 600 MG tablet Take 1 tablet (600 mg total) by mouth every 6 (six) hours. (Patient not taking: Reported on 09/24/2017) 30 tablet 0  . Prenatal Vit-Fe Fumarate-FA (PRENATAL MULTIVITAMIN) TABS tablet Take 1 tablet by mouth every morning. 30 tablet 10  . senna-docusate (SENOKOT-S) 8.6-50 MG tablet Take 2 tablets by mouth at bedtime as needed for mild constipation. (Patient not taking: Reported on 09/24/2017) 60 tablet 0       Allergies  Allergen Reactions  . Peanuts [Peanut Oil] Anaphylaxis and Hives  ROS:  Review of Systems  Constitutional:  Positive for Hypertension  Eyes: Negative.  Respiratory: Negative.  Cardiovascular: Negative.  Gastrointestinal: Negative.  Genitourinary: Negative.  Musculoskeletal: Negative.  Neurological: Negative.  Psychiatric/Behavioral: Negative.  I have reviewed patient's Past Medical Hx, Surgical Hx, Family Hx, Social Hx, medications and allergies.  Physical Exam  Patient Vitals for the past 24 hrs:   BP Temp Temp src Pulse Resp SpO2 Weight  09/24/17 1400 (!) 143/89 - - 99 20 - -  09/24/17 1349 (!) 167/108 - - 74 - - -  09/24/17 1341 (!) 172/99 - - (!) 52 - - -  09/24/17 1331 (!) 159/107 - - 78 - - -  09/24/17 1321 (!) 170/111 - - 67 - - -  09/24/17 1311 (!) 176/109 - - (!) 56 - - -  09/24/17 1301 (!) 172/109 - - (!) 58 - - -  09/24/17 1250 (!) 171/110 - - 64 - - -  09/24/17 1240 (!) 169/107 - - 60 - - -  09/24/17 1231 (!) 161/102 - - (!) 58 - - -  09/24/17 1220 (!) 176/95 - - (!) 59 - - -  09/24/17 1211 (!) 159/93 - - (!) 53 - - -  09/24/17 1200 (!) 151/102 - - 64 - - -  09/24/17 1142 (!) 154/104 - - (!) 53 - - -  09/24/17 1133 (!) 150/109 - - (!) 47 - - -  09/24/17 1116 (!) 161/104 - - (!) 50 - - -  09/24/17 1101 (!) 164/92 - - (!) 50 - - -  09/24/17 1046 (!) 154/100 - - (!) 49 - - -  09/24/17 1035 (!) 142/84 - - (!) 47 - - -  09/24/17 1015 (!) 153/106 - - (!) 55 - - -  09/24/17 1005 (!) 141/92 - - (!) 49 - - -   09/24/17 0954 (!) 156/92 97.9 F (36.6 C) Oral - 16 97 % -  09/24/17 0953 - - - - - - 175 lb (79.4 kg)  Constitutional: Well-developed, well-nourished female in no acute distress.  Cardiovascular: normal rate  Respiratory: normal effort  GI: Abd soft, non-tender. Pos BS x 4  MS: Extremities nontender, no edema, normal ROM  Neurologic: Alert and oriented x 4.  GU: Neg CVAT.  PELVIC EXAM: deferred  LAB RESULTS       Results for orders placed or performed during the hospital encounter of 09/24/17 (from the past 24 hour(s))  Protein / creatinine ratio, urine Status: None   Collection Time: 09/24/17 10:14 AM  Result Value Ref Range   Creatinine, Urine 97.00 mg/dL   Total Protein, Urine <6 mg/dL   Protein Creatinine Ratio  0.00 - 0.15 mg/mg[Cre]  CBC Status: None   Collection Time: 09/24/17 10:25 AM  Result Value Ref Range   WBC 5.5 4.0 - 10.5 K/uL   RBC 4.88 3.87 - 5.11 MIL/uL   Hemoglobin 13.6 12.0 - 15.0 g/dL   HCT 16.1 09.6 - 04.5 %   MCV 83.4 78.0 - 100.0 fL   MCH 27.9 26.0 - 34.0 pg   MCHC 33.4 30.0 - 36.0 g/dL   RDW 40.9 81.1 - 91.4 %   Platelets 228 150 - 400 K/uL  Comprehensive metabolic panel Status: Abnormal   Collection Time: 09/24/17 10:25 AM  Result Value Ref Range   Sodium 139 135 - 145 mmol/L   Potassium 3.6 3.5 - 5.1 mmol/L   Chloride 110 101 - 111 mmol/L   CO2 21 (L) 22 - 32 mmol/L   Glucose, Bld 87 65 - 99 mg/dL   BUN 10 6 - 20 mg/dL   Creatinine, Ser 7.82 0.44 - 1.00 mg/dL   Calcium 9.0 8.9 - 95.6 mg/dL   Total Protein 7.4 6.5 - 8.1 g/dL   Albumin 4.1 3.5 - 5.0 g/dL   AST 15 15 - 41 U/L   ALT 14 14 - 54 U/L   Alkaline Phosphatase 86 38 - 126 U/L   Total Bilirubin 0.3 0.3 - 1.2 mg/dL   GFR calc non Af Amer >60 >60 mL/min   GFR calc Af Amer >60 >60 mL/min   Anion gap 8 5 - 15  --/--/A POS (01/02 0259)  MAU Management/MDM:     Orders Placed This Encounter  Procedures  . CBC  . Comprehensive metabolic panel  .  Protein / creatinine ratio, urine   . Check blood pressure 20 minutes after giving hydrALAZINE 10 mg IV dose. Call MD if SBP >/= 160 and/or DBP >/= 110.  . Once BP goal is reached, repeat BP every 10 minutes for 1 hour, then every 15 minutes for 1 hours, then per policy for antepartum labor or post-partum.  Pre-E labs: WNL     Meds ordered this encounter  Medications  . labetalol (NORMODYNE,TRANDATE) injection 20-80 mg  . hydrALAZINE (APRESOLINE) injection 10 mg  Treatments in MAU included labetalol 20mg , labetalol 40mg , labetalol 80mg , hydralazine 10mg  x2doses with no decrease in BP.     ASSESSMENT  1. Preeclampsia in postpartum period   PLAN  Patient without a history of antepartum or peripartum hypertension Will admit for seizure prophylaxis with magnesium sulfate and management of hypertension Will monitor BP closely

## 2017-09-24 NOTE — MAU Provider Note (Signed)
Chief Complaint: Hypertension   First Provider Initiated Contact with Patient 09/24/17 1009      SUBJECTIVE HPI: Shelby Olsen is a 22 y.o. [id number] 4 weeks PP who presents to maternity admissions sent from the office reporting hypertension. She denies history of hypertension prior to pregnancy or during. Has never received medication for hypertension antenatal or intrapartum. She reports that her BP at the office this morning around 0900 was elevated. Today in the office her BP was 170/100 then 166/116. She denies any s/s of preeclampsia. Denies HA, vision changes, epigastric pain, increased swelling. She denies vaginal bleeding, vaginal itching/burning, urinary symptoms, dizziness, n/v, or fever/chills. She plans Nexplanon for birth control.   Past Medical History:  Diagnosis Date  . Hx of measles   . IUFD (intrauterine fetal death) - in previous pregnancy 12/06/2014  . Migraine    Past Surgical History:  Procedure Laterality Date  . NO PAST SURGERIES     Social History   Socioeconomic History  . Marital status: Single    Spouse name: Not on file  . Number of children: Not on file  . Years of education: Not on file  . Highest education level: Not on file  Social Needs  . Financial resource strain: Not on file  . Food insecurity - worry: Not on file  . Food insecurity - inability: Not on file  . Transportation needs - medical: Not on file  . Transportation needs - non-medical: Not on file  Occupational History  . Not on file  Tobacco Use  . Smoking status: Never Smoker  . Smokeless tobacco: Never Used  Substance and Sexual Activity  . Alcohol use: No  . Drug use: No    Comment: around people who smokes marijuana. SO smells of it.   . Sexual activity: Not Currently    Birth control/protection: None  Other Topics Concern  . Not on file  Social History Narrative  . Not on file   No current facility-administered medications on file prior to encounter.    Current  Outpatient Medications on File Prior to Encounter  Medication Sig Dispense Refill  . ibuprofen (ADVIL,MOTRIN) 600 MG tablet Take 1 tablet (600 mg total) by mouth every 6 (six) hours. (Patient not taking: Reported on 09/24/2017) 30 tablet 0  . Prenatal Vit-Fe Fumarate-FA (PRENATAL MULTIVITAMIN) TABS tablet Take 1 tablet by mouth every morning. 30 tablet 10  . senna-docusate (SENOKOT-S) 8.6-50 MG tablet Take 2 tablets by mouth at bedtime as needed for mild constipation. (Patient not taking: Reported on 09/24/2017) 60 tablet 0   Allergies  Allergen Reactions  . Peanuts [Peanut Oil] Anaphylaxis and Hives    ROS:  Review of Systems  Constitutional:       Positive for Hypertension  Eyes: Negative.   Respiratory: Negative.   Cardiovascular: Negative.   Gastrointestinal: Negative.   Genitourinary: Negative.   Musculoskeletal: Negative.   Neurological: Negative.   Psychiatric/Behavioral: Negative.    I have reviewed patient's Past Medical Hx, Surgical Hx, Family Hx, Social Hx, medications and allergies.   Physical Exam   Patient Vitals for the past 24 hrs:  BP Temp Temp src Pulse Resp SpO2 Weight  09/24/17 1400 (!) 143/89 - - 99 20 - -  09/24/17 1349 (!) 167/108 - - 74 - - -  09/24/17 1341 (!) 172/99 - - (!) 52 - - -  09/24/17 1331 (!) 159/107 - - 78 - - -  09/24/17 1321 (!) 170/111 - - 67 - - -  09/24/17 1311 (!) 176/109 - - (!) 56 - - -  09/24/17 1301 (!) 172/109 - - (!) 58 - - -  09/24/17 1250 (!) 171/110 - - 64 - - -  09/24/17 1240 (!) 169/107 - - 60 - - -  09/24/17 1231 (!) 161/102 - - (!) 58 - - -  09/24/17 1220 (!) 176/95 - - (!) 59 - - -  09/24/17 1211 (!) 159/93 - - (!) 53 - - -  09/24/17 1200 (!) 151/102 - - 64 - - -  09/24/17 1142 (!) 154/104 - - (!) 53 - - -  09/24/17 1133 (!) 150/109 - - (!) 47 - - -  09/24/17 1116 (!) 161/104 - - (!) 50 - - -  09/24/17 1101 (!) 164/92 - - (!) 50 - - -  09/24/17 1046 (!) 154/100 - - (!) 49 - - -  09/24/17 1035 (!) 142/84 - - (!) 47 -  - -  09/24/17 1015 (!) 153/106 - - (!) 55 - - -  09/24/17 1005 (!) 141/92 - - (!) 49 - - -  09/24/17 0954 (!) 156/92 97.9 F (36.6 C) Oral - 16 97 % -  09/24/17 0953 - - - - - - 175 lb (79.4 kg)   Constitutional: Well-developed, well-nourished female in no acute distress.  Cardiovascular: normal rate Respiratory: normal effort GI: Abd soft, non-tender. Pos BS x 4 MS: Extremities nontender, no edema, normal ROM Neurologic: Alert and oriented x 4.  GU: Neg CVAT. PELVIC EXAM: deferred   LAB RESULTS Results for orders placed or performed during the hospital encounter of 09/24/17 (from the past 24 hour(s))  Protein / creatinine ratio, urine     Status: None   Collection Time: 09/24/17 10:14 AM  Result Value Ref Range   Creatinine, Urine 97.00 mg/dL   Total Protein, Urine <6 mg/dL   Protein Creatinine Ratio        0.00 - 0.15 mg/mg[Cre]  CBC     Status: None   Collection Time: 09/24/17 10:25 AM  Result Value Ref Range   WBC 5.5 4.0 - 10.5 K/uL   RBC 4.88 3.87 - 5.11 MIL/uL   Hemoglobin 13.6 12.0 - 15.0 g/dL   HCT 40.940.7 81.136.0 - 91.446.0 %   MCV 83.4 78.0 - 100.0 fL   MCH 27.9 26.0 - 34.0 pg   MCHC 33.4 30.0 - 36.0 g/dL   RDW 78.212.4 95.611.5 - 21.315.5 %   Platelets 228 150 - 400 K/uL  Comprehensive metabolic panel     Status: Abnormal   Collection Time: 09/24/17 10:25 AM  Result Value Ref Range   Sodium 139 135 - 145 mmol/L   Potassium 3.6 3.5 - 5.1 mmol/L   Chloride 110 101 - 111 mmol/L   CO2 21 (L) 22 - 32 mmol/L   Glucose, Bld 87 65 - 99 mg/dL   BUN 10 6 - 20 mg/dL   Creatinine, Ser 0.860.66 0.44 - 1.00 mg/dL   Calcium 9.0 8.9 - 57.810.3 mg/dL   Total Protein 7.4 6.5 - 8.1 g/dL   Albumin 4.1 3.5 - 5.0 g/dL   AST 15 15 - 41 U/L   ALT 14 14 - 54 U/L   Alkaline Phosphatase 86 38 - 126 U/L   Total Bilirubin 0.3 0.3 - 1.2 mg/dL   GFR calc non Af Amer >60 >60 mL/min   GFR calc Af Amer >60 >60 mL/min   Anion gap 8 5 - 15    --/--/A POS (  01/02 0259)   MAU Management/MDM: Orders Placed This  Encounter  Procedures  . CBC  . Comprehensive metabolic panel  . Protein / creatinine ratio, urine  . Check blood pressure 20 minutes after giving hydrALAZINE 10 mg IV dose. Call MD if SBP >/= 160 and/or DBP >/= 110.  . Once BP goal is reached, repeat BP every 10 minutes for 1 hour, then every 15 minutes for 1 hours, then per policy for antepartum labor or post-partum.  Pre-E labs: WNL   Meds ordered this encounter  Medications  . labetalol (NORMODYNE,TRANDATE) injection 20-80 mg  . hydrALAZINE (APRESOLINE) injection 10 mg   Treatments in MAU included labetalol 20mg , labetalol 40mg , labetalol 80mg , hydralazine 10mg  x2doses with no decrease in BP.   Consult Dr Jolayne Panther with assessment, BP and management.  Recommends admission to antenatal for magnesium treatment.   ASSESSMENT 1. Preeclampsia in postpartum period     PLAN Admit to ante for magnesium treatment  Care transferred to Dr Jolayne Panther for management, orders placed   Steward Drone  Certified Nurse-Midwife 09/24/2017  2:10 PM

## 2017-09-24 NOTE — Progress Notes (Signed)
..  Post Partum Exam  Shelby Olsen is a 22 y.o. [id number] female who presents for a postpartum visit. She is 4 weeks postpartum following a spontaneous vaginal delivery. I have fully reviewed the prenatal and intrapartum course. The delivery was at 39.1 gestational weeks.  Anesthesia: none. Postpartum course has been good. Baby's course has been good. Baby is feeding by bottle - Gerber Gentle. Bleeding no bleeding. Bowel function is normal. Bladder function is normal. Patient is not sexually active. Contraception method is none. Postpartum depression screening:neg  Elevated Blood Pressures today. Probable hypertension. No hx of pre-eclampsia.  Sent to ED for evaluation of elevated blood pressures.  Will reschedule exam and Nexplanon.

## 2017-09-24 NOTE — MAU Note (Signed)
Pt was a PP visit and had HTN. No headaches or visual changes. No pain

## 2017-09-25 DIAGNOSIS — O1495 Unspecified pre-eclampsia, complicating the puerperium: Principal | ICD-10-CM

## 2017-09-25 MED ORDER — AMLODIPINE BESYLATE 10 MG PO TABS: 10.0000 mg | ORAL_TABLET | Freq: Every day | ORAL | 0 refills | Status: AC

## 2017-09-25 NOTE — Progress Notes (Signed)
Discharge teaching complete with pt. Pt understood all information and did not have any questions. Pt discharged home to family. 

## 2017-09-25 NOTE — Discharge Summary (Signed)
    OB Discharge Summary     Patient Name: Shelby Olsen DOB: 09/02/1995 MRN: [id number]  Date of admission: 09/24/2017 Delivering MD: This patient has no babies on file.  Date of discharge: 09/25/2017  Admitting diagnosis: PP High BP Intrauterine pregnancy: Unknown     Secondary diagnosis:  Active Problems:   Preeclampsia in postpartum period  Hospital course:  Patient admitted secondary to severe range BP during her postpartum visit. Patient remained asymptomatic and denied HA, visual changes, RUQ/epigastric pain. She received magnesium sulfate for seizure prophylaxis for 24 hours and was started on Norvasc. Her blood pressure improved significantly. She was found stable for discharge with plan for BP monitoring outpatient. Patient was eager to be discharged as she needed to return home to care for her children but understands the importance of close follow up.  Physical exam  Vitals:   09/25/17 0800 09/25/17 1200 09/25/17 1452 09/25/17 1603  BP: 128/78 131/81 129/84 138/85  Pulse: 89 92 96 88  Resp: 18 18 16 17   Temp: 98.6 F (37 C) 98.1 F (36.7 C)  98.2 F (36.8 C)  TempSrc: Oral Oral  Oral  SpO2: 99% 99%  100%  Weight:      Height:       General: alert, cooperative and no distress Abdomen:  Soft, non tender non distended DVT Evaluation: No evidence of DVT seen on physical exam. No cords or calf tenderness. Labs: Lab Results  Component Value Date   WBC 5.5 09/24/2017   HGB 13.6 09/24/2017   HCT 40.7 09/24/2017   MCV 83.4 09/24/2017   PLT 228 09/24/2017   CMP Latest Ref Rng & Units 09/24/2017  Glucose 65 - 99 mg/dL 87  BUN 6 - 20 mg/dL 10  Creatinine 4.090.44 - 8.111.00 mg/dL 9.140.66  Sodium 782135 - 956145 mmol/L 139  Potassium 3.5 - 5.1 mmol/L 3.6  Chloride 101 - 111 mmol/L 110  CO2 22 - 32 mmol/L 21(L)  Calcium 8.9 - 10.3 mg/dL 9.0  Total Protein 6.5 - 8.1 g/dL 7.4  Total Bilirubin 0.3 - 1.2 mg/dL 0.3  Alkaline Phos 38 - 126 U/L 86  AST 15 - 41 U/L 15  ALT 14 - 54  U/L 14    Discharge instruction: per After Visit Summary and "Baby and Me Booklet".  After visit meds:  Allergies as of 09/25/2017      Reactions   Peanuts [peanut Oil] Anaphylaxis, Hives      Medication List    STOP taking these medications   senna-docusate 8.6-50 MG tablet Commonly known as:  Senokot-S     TAKE these medications   amLODipine 10 MG tablet Commonly known as:  NORVASC Take 1 tablet (10 mg total) by mouth daily. Start taking on:  09/26/2017   ibuprofen 600 MG tablet Commonly known as:  ADVIL,MOTRIN Take 1 tablet (600 mg total) by mouth every 6 (six) hours.   prenatal multivitamin Tabs tablet Take 1 tablet by mouth every morning.       Diet: routine diet  Activity: Advance as tolerated. Pelvic rest for 6 weeks.   Outpatient follow up: 09/26/2017 at 8 am Follow up Appt: Future Appointments  Date Time Provider Department Center  09/26/2017  8:00 AM [facility] NURSE [facility] None   Follow up Visit:No Follow-up on file.  Postpartum contraception: Nexplanon. Patient to schedule nexplanon insertion appointment during her BP check appointment    09/25/2017 Catalina AntiguaPeggy Vear Staton, MD

## 2017-09-25 NOTE — Progress Notes (Signed)
Patient not feeling any discomfort at this time.  I monitored her with toco and did not see any UC.

## 2017-09-25 NOTE — Discharge Instructions (Signed)
Postpartum Hypertension °Postpartum hypertension is high blood pressure after pregnancy that remains higher than normal for more than two days after delivery. You may not realize that you have postpartum hypertension if your blood pressure is not being checked regularly. In some cases, postpartum hypertension will go away on its own, usually within a week of delivery. However, for some women, medical treatment is required to prevent serious complications, such as seizures or stroke. °The following things can affect your blood pressure: °· The type of delivery you had. °· Having received IV fluids or other medicines during or after delivery. ° °What are the causes? °Postpartum hypertension may be caused by any of the following or by a combination of any of the following: °· Hypertension that existed before pregnancy (chronic hypertension). °· Gestational hypertension. °· Preeclampsia or eclampsia. °· Receiving a lot of fluid through an IV during or after delivery. °· Medicines. °· HELLP syndrome. °· Hyperthyroidism. °· Stroke. °· Other rare neurological or blood disorders. ° °In some cases, the cause may not be known. °What increases the risk? °Postpartum hypertension can be related to one or more risk factors, such as: °· Chronic hypertension. In some cases, this may not have been diagnosed before pregnancy. °· Obesity. °· Type 2 diabetes. °· Kidney disease. °· Family history of preeclampsia. °· Other medical conditions that cause hormonal imbalances. ° °What are the signs or symptoms? °As with all types of hypertension, postpartum hypertension may not have any symptoms. Depending on how high your blood pressure is, you may experience: °· Headaches. These may be mild, moderate, or severe. They may also be steady, Shelby Olsen, or sudden in onset (thunderclap headache). °· Visual changes. °· Dizziness. °· Shortness of breath. °· Swelling of your hands, feet, lower legs, or face. In some cases, you may have swelling in  more than one of these locations. °· Heart palpitations or a racing heartbeat. °· Difficulty breathing while lying down. °· Decreased urination. ° °Other rare signs and symptoms may include: °· Sweating more than usual. This lasts longer than a few days after delivery. °· Chest pain. °· Sudden dizziness when you get up from sitting or lying down. °· Seizures. °· Nausea or vomiting. °· Abdominal pain. ° °How is this diagnosed? °The diagnosis of postpartum hypertension is made through a combination of physical examination findings and testing of your blood and urine. You may also have additional tests, such as a CT scan or an MRI, to check for other complications of postpartum hypertension. °How is this treated? °When blood pressure is high enough to require treatment, your options may include: °· Medicines to reduce blood pressure (antihypertensives). Tell your health care provider if you are breastfeeding or if you plan to breastfeed. There are many antihypertensive medicines that are safe to take while breastfeeding. °· Stopping medicines that may be causing hypertension. °· Treating medical conditions that are causing hypertension. °· Treating the complications of hypertension, such as seizures, stroke, or kidney problems. ° °Your health care provider will also continue to monitor your blood pressure closely and repeatedly until it is within a safe range for you. °Follow these instructions at home: °· Take medicines only as directed by your health care provider. °· Get regular exercise after your health care provider tells you that it is safe. °· Follow your health care provider’s recommendations on fluid and salt restrictions. °· Do not use any tobacco products, including cigarettes, chewing tobacco, or electronic cigarettes. If you need help quitting, ask your health care provider. °·   Keep all follow-up visits as directed by your health care provider. This is important. °Contact a health care provider  if: °· Your symptoms get worse. °· You have new symptoms, such as: °? Headache. °? Dizziness. °? Visual changes. °Get help right away if: °· You develop a severe or sudden headache. °· You have seizures. °· You develop numbness or weakness on one side of your body. °· You have difficulty thinking, speaking, or swallowing. °· You develop severe abdominal pain. °· You develop difficulty breathing, chest pain, a racing heartbeat, or heart palpitations. °These symptoms may represent a serious problem that is an emergency. Do not wait to see if the symptoms will go away. Get medical help right away. Call your local emergency services (911 in the [place] Do not drive yourself to the hospital. °This information is not intended to replace advice given to you by your health care provider. Make sure you discuss any questions you have with your health care provider. °Document Released: 04/15/2014 Document Revised: 01/15/2016 Document Reviewed: 02/24/2014 °Elsevier Interactive Patient Education © 2018 [employer] ° °

## 2017-09-26 ENCOUNTER — Ambulatory Visit: Payer: Medicaid Other

## 2017-09-26 VITALS — BP 134/86

## 2017-09-26 DIAGNOSIS — O1495 Unspecified pre-eclampsia, complicating the puerperium: Secondary | ICD-10-CM

## 2017-09-26 NOTE — Progress Notes (Signed)
Subjective:  Shelby Olsen is a 22 y.o. female with hypertension. Current Outpatient Medications  Medication Sig Dispense Refill  . amLODipine (NORVASC) 10 MG tablet Take 1 tablet (10 mg total) by mouth daily. 30 tablet 0  . Prenatal Vit-Fe Fumarate-FA (PRENATAL MULTIVITAMIN) TABS tablet Take 1 tablet by mouth every morning. 30 tablet 10  . ibuprofen (ADVIL,MOTRIN) 600 MG tablet Take 1 tablet (600 mg total) by mouth every 6 (six) hours. (Patient not taking: Reported on 09/24/2017) 30 tablet 0   No current facility-administered medications for this visit.     Hypertension ROS: taking medications as instructed, no medication side effects noted, no TIA's, no chest pain on exertion, no dyspnea on exertion and no swelling of ankles.  New concerns: None.   Objective:  Appearance alert, well appearing, and in no distress. General exam BP noted to be well controlled today in office.    Assessment:   Hypertension stable.   Plan:  Current treatment plan is effective, no change in therapy.

## 2017-09-26 NOTE — Progress Notes (Signed)
Chart reviewed for nurse visit. Agree with plan of care.   Rolm Bookbindereill, Randolf Sansoucie M, [place] 09/26/2017 10:56 AM

## 2017-10-06 ENCOUNTER — Ambulatory Visit (INDEPENDENT_AMBULATORY_CARE_PROVIDER_SITE_OTHER): Payer: Medicaid Other | Admitting: Certified Nurse Midwife

## 2017-10-06 ENCOUNTER — Encounter: Payer: Self-pay | Admitting: Certified Nurse Midwife

## 2017-10-06 ENCOUNTER — Encounter: Payer: Self-pay | Admitting: *Deleted

## 2017-10-06 VITALS — BP 123/85 | HR 83 | Wt 174.5 lb

## 2017-10-06 DIAGNOSIS — Z3202 Encounter for pregnancy test, result negative: Secondary | ICD-10-CM | POA: Diagnosis not present

## 2017-10-06 DIAGNOSIS — Z3049 Encounter for surveillance of other contraceptives: Secondary | ICD-10-CM

## 2017-10-06 DIAGNOSIS — Z30017 Encounter for initial prescription of implantable subdermal contraceptive: Secondary | ICD-10-CM

## 2017-10-06 LAB — POCT URINE PREGNANCY: Preg Test, Ur: NEGATIVE

## 2017-10-06 MED ORDER — ETONOGESTREL 68 MG ~~LOC~~ IMPL
68.0000 mg | DRUG_IMPLANT | Freq: Once | SUBCUTANEOUS | Status: AC
  Administered 2017-10-06: 68 mg via SUBCUTANEOUS

## 2017-10-06 NOTE — Progress Notes (Signed)
Post Partum Exam  Shelby Olsen is a 22 y.o. [id number] female who presents for a postpartum visit. She is 5 weeks postpartum following a spontaneous vaginal delivery. I have fully reviewed the prenatal and intrapartum course. The delivery was at 40 gestational weeks.  Anesthesia: none. Postpartum course has been unremarkable. Baby's course has been unremarkable. Baby is feeding by bottle - Gentle [employer]erber Start. Bleeding no bleeding. Bowel function is normal. Bladder function is normal. Patient is not sexually active. Contraception method is none. Postpartum depression screening:neg Wants Nexplanon.   The following portions of the patient's history were reviewed and updated as appropriate: allergies, current medications, past family history, past medical history, past social history, past surgical history and problem list. Last pap smear done 04/03/17 and was Normal  Review of Systems Pertinent items noted in HPI and remainder of comprehensive ROS otherwise negative.    Objective:  not currently breastfeeding.  General:  alert, cooperative and no distress   Breasts:  inspection negative, no nipple discharge or bleeding, no masses or nodularity palpable  Lungs: clear to auscultation bilaterally  Heart:  regular rate and rhythm, S1, S2 normal, no murmur, click, rub or gallop  Abdomen: soft, non-tender; bowel sounds normal; no masses,  no organomegaly  Pelvic/Rectal Exam: Not performed.        Assessment:    Normal 4 week postpartum exam. Pap smear not done at today's visit.   Plan:   1. Contraception: abstinence and Nexplanon 2. Nexplanon placed today.  3. Follow up in: 8 months for annual exam or as needed.

## 2017-10-08 DIAGNOSIS — Z30017 Encounter for initial prescription of implantable subdermal contraceptive: Secondary | ICD-10-CM | POA: Insufficient documentation

## 2017-10-08 NOTE — Progress Notes (Signed)
Nexplanon Procedure Note   PRE-OP DIAGNOSIS: desired long-term, reversible contraception  POST-OP DIAGNOSIS: Same  PROCEDURE: Nexplanon  placement Performing Provider: Orvilla Cornwallachelle Inetta Dicke CNM   Patient education prior to procedure, explained risk, benefits of Nexplanon, reviewed alternative options. Patient reported understanding. Gave consent to continue with procedure.   PROCEDURE:  Pregnancy Text :  Negative Site (check):      left arm         Sterile Preparation:   Betadinex3 Lot # [id number]  [id number] Expiration Date  12/2018  Insertion site was selected 8 - 10 cm from medial epicondyle and marked along with guiding site using sterile marker. Procedure area was prepped and draped in a sterile fashion. 1% Lidocaine 1.5 ml given prior to procedure. Nexplanon  was inserted subcutaneously.Needle was removed from the insertion site. Nexplanon capsule was palpated by provider and patient to assure satisfactory placement. And a bandage applied and the arm was wrapped with gauze bandage.     Followup: The patient tolerated the procedure well without complications.  Instructions:  The patient was instructed to remove the dressing in 24 hours and that some bruising is to be expected.  She was advised to use over the counter analgesics as needed for any pain at the site.  She is to keep the area dry for 24 hours and to call if her hand or arm becomes cold, numb, or blue.   Orvilla Cornwallachelle Arrington Yohe CNM

## 2018-03-15 IMAGING — US US MFM OB FOLLOW-UP
1 series · 14 of 28 positions shown · non-contrast
Comparison: none

[Series 1: us mfm ob follow-up · 14 of 61 slices shown]
[im 3/61]
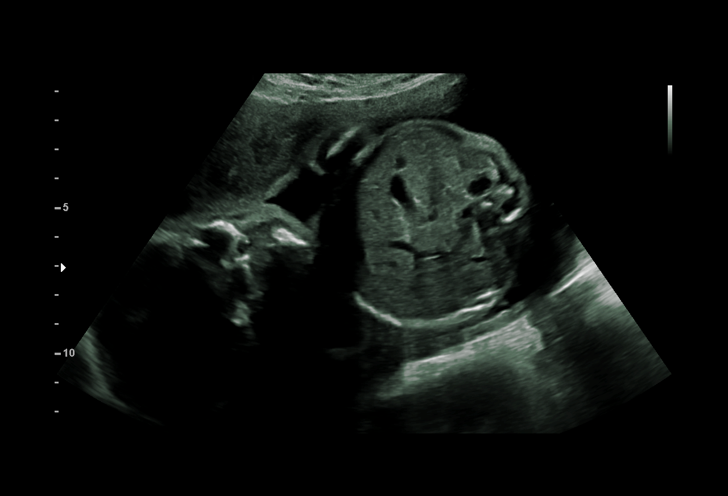
[im 7/61]
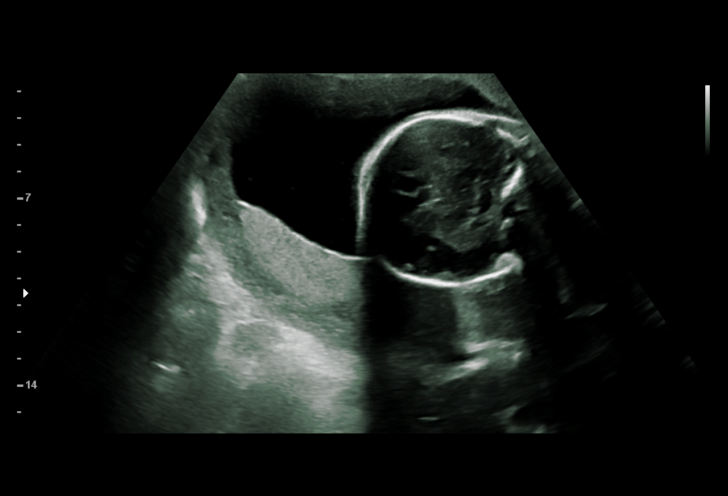
[im 12/61]
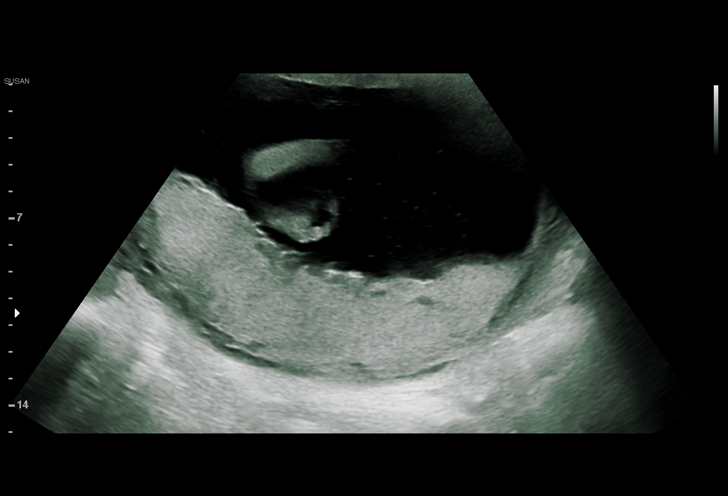
[im 16/61]
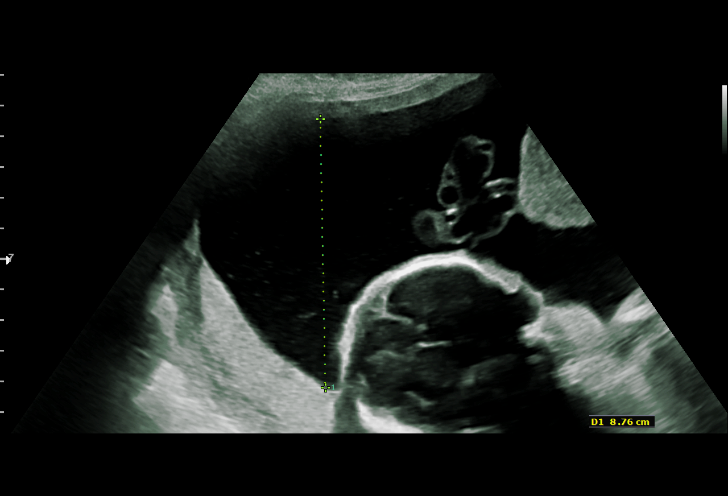
[im 21/61]
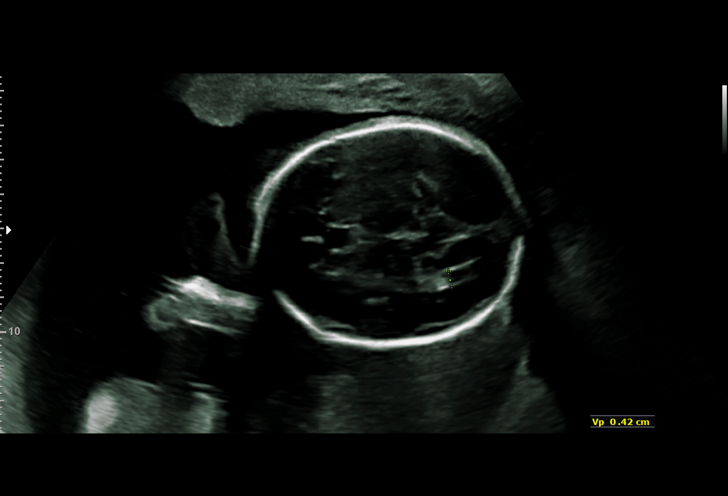
[im 25/61]
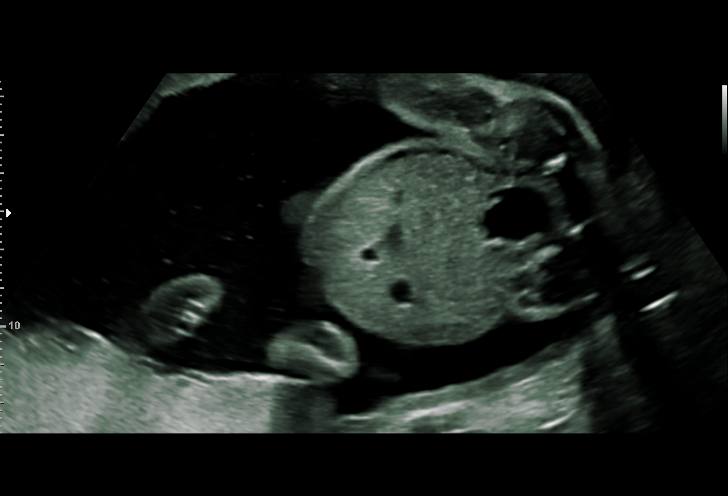
[im 29/61]
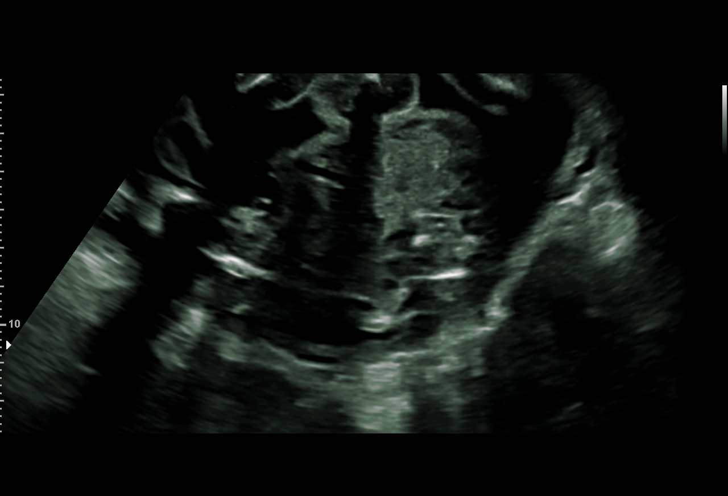
[im 34/61]
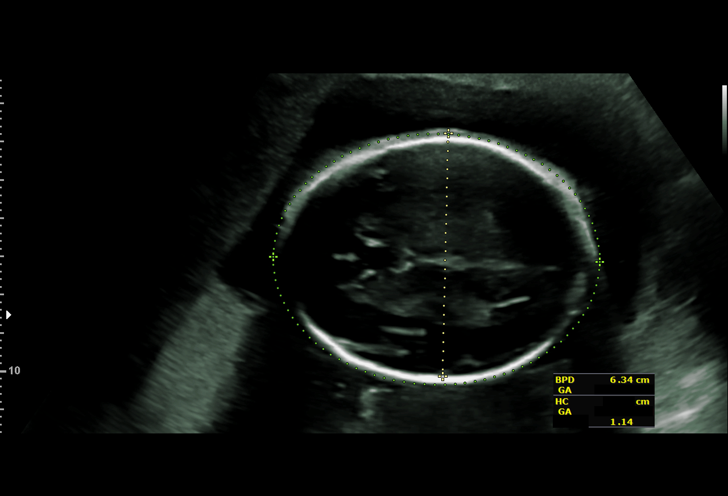
[im 38/61]
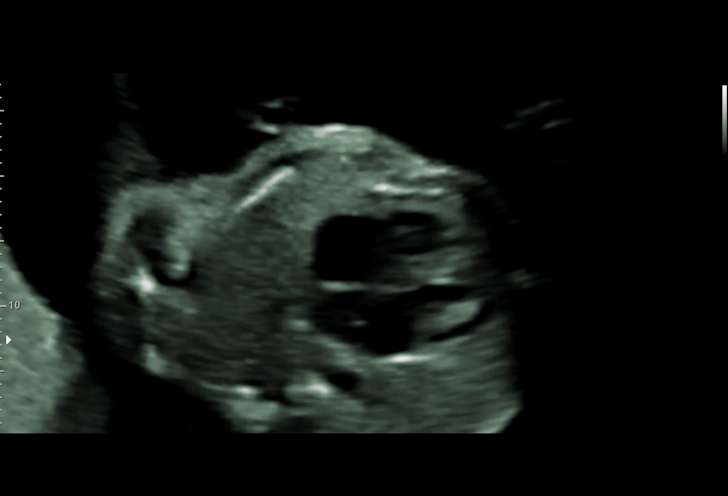
[im 43/61]
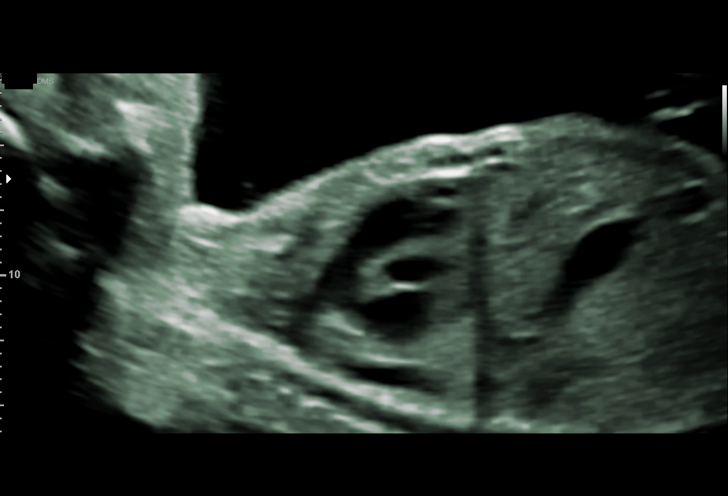
[im 47/61]
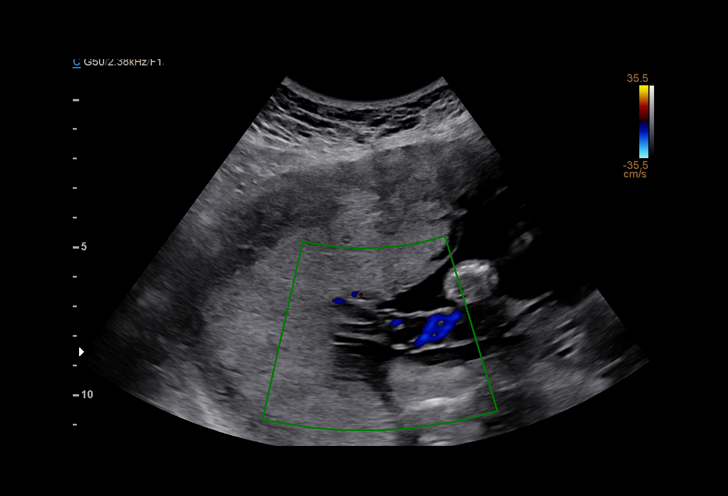
[im 52/61]
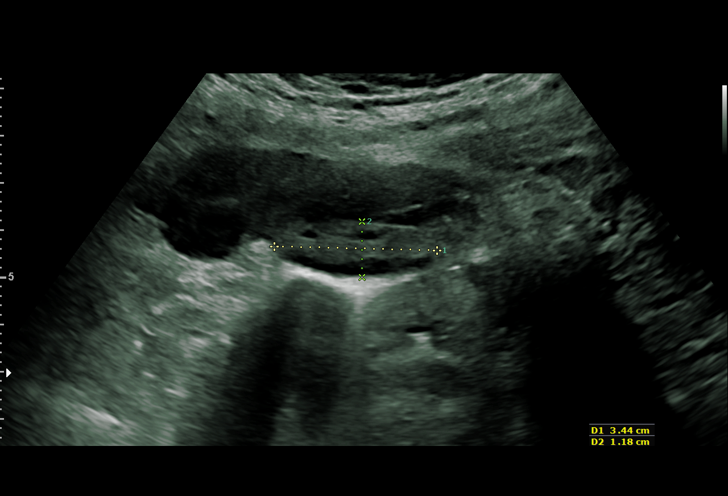
[im 56/61]
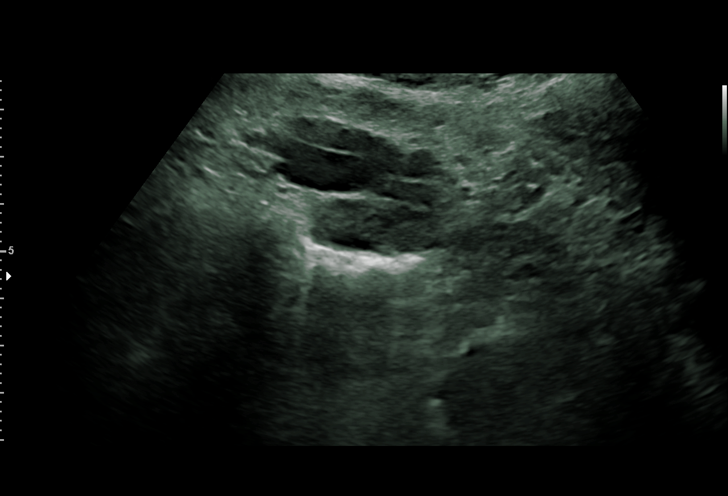
[im 61/61]
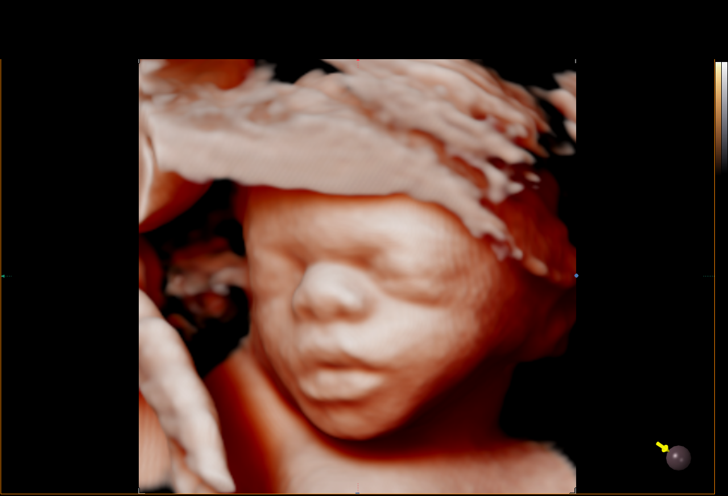

[14 of 28 positions shown; findings below may reference images not displayed]

Name:       ROSEANN IRIGOYEN            Visit Date:  05/21/2017 [REDACTED] [HOSPITAL]

1  KLPIGBB MOOLMAN            [id number]      [id number]     [id number]
Indications

25 weeks gestation of pregnancy
Short interval between pregancies, 2nd
trimester
Poor obstetric history: Previous IUFD ( 1536,
39 weeks, 5+9, [address])
Late prenatal care, second trimester
Obesity complicating pregnancy, second
trimester
OB History

Blood Type:            Height:  5'1"   Weight (lb):  181      BMI:
Gravidity:    5         Term:   4        Prem:   0        SAB:   0
TOP:          0       Ectopic:  0        Living: 3
Fetal Evaluation

Num Of Fetuses:     1
Fetal Heart         153
Rate(bpm):
Cardiac Activity:   Observed
Presentation:       Breech
Placenta:           Posterior, above cervical os

Amniotic Fluid
AFI FV:      Subjectively within normal limits

Largest Pocket(cm)
8.7
Biometry
BPD:      63.2  mm     G. Age:  25w 4d         58  %    CI:        73.84   %   70 - 86
FL/HC:      20.4   %   18.7 -
HC:      233.6  mm     G. Age:  25w 3d         38  %    HC/AC:      1.10       1.04 -
AC:      211.8  mm     G. Age:  25w 5d         59  %    FL/BPD:     75.5   %   71 - 87
FL:       47.7  mm     G. Age:  26w 0d         62  %    FL/AC:      22.5   %   20 - 24
HUM:      45.4  mm     G. Age:  26w 6d         85  %
CER:      30.9  mm     G. Age:  27w 0d         89  %

LV:        4.2  mm
Est. FW:     849  gm    1 lb 14 oz      65  %
Gestational Age

LMP:           26w 2d       Date:   [DATE]                 EDD:   11/18/16
U/S Today:     25w 5d                                        EDD:   08/25/17
Best:          25w 1d    Det. By:   Early Ultrasound         EDD:   08/29/17
(09/02/17)
Anatomy

Cranium:               Appears normal         Aortic Arch:            Previously seen
Cavum:                 Appears normal         Ductal Arch:            Appears normal
Ventricles:            Appears normal         Diaphragm:              Appears normal
Choroid Plexus:        Appears normal         Stomach:                Appears normal, left
sided
Cerebellum:            Appears normal         Abdomen:                Appears normal
Posterior Fossa:       Appears normal         Abdominal Wall:         Appears nml (cord
insert, abd wall)
Nuchal Fold:           Previously seen        Cord Vessels:           Appears normal (3
vessel cord)
Face:                  Orbits nl; profile not Kidneys:                Appear normal
well visualized
Lips:                  Appears normal         Bladder:                Appears normal
Thoracic:              Appears normal         Spine:                  Previously seen
Heart:                 Appears normal         Upper Extremities:      Previously seen
(4CH, axis, and situs
RVOT:                  Appears normal         Lower Extremities:      Previously seen
LVOT:                  Appears normal

Other:  Fetus appears to be a male. Heels and 5th digit previously seen.
Nasal bone visualized.
Cervix Uterus Adnexa

Cervix
Length:            4.7  cm.
Normal appearance by transabdominal scan.

Left Ovary
Within normal limits.

Right Ovary
Within normal limits.
Impression

SIUP at 25+1 weeks with previous IUFD and SGA baby
Normal interval review of fetal anatomy
Normal amniotic fluid volume
Measurements show growth in the 65th percentile
Recommendations

Follow-up ultrasound for growth in 6 weeks (h/o IUFD and
SGA fetus)

## 2018-04-12 IMAGING — US US MFM FETAL BPP W/O NON-STRESS
1 series · 14 of 14 positions shown · non-contrast
Comparison: none

[Series 1: us mfm fetal bpp w/o non-stress · 14 acquisitions, 14 frames shown]
[im 1/14]
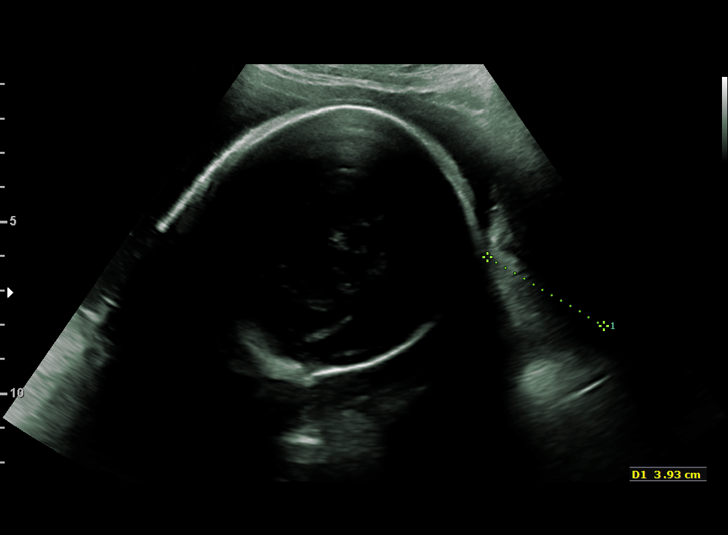
[im 2/14]
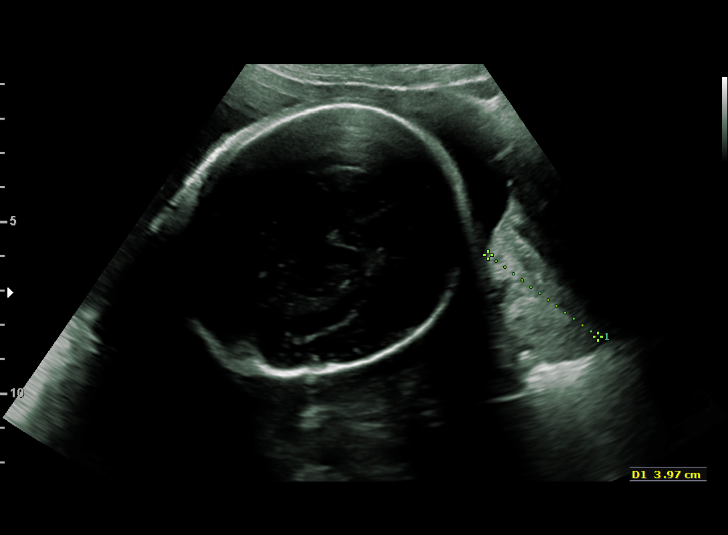
[im 3/14]
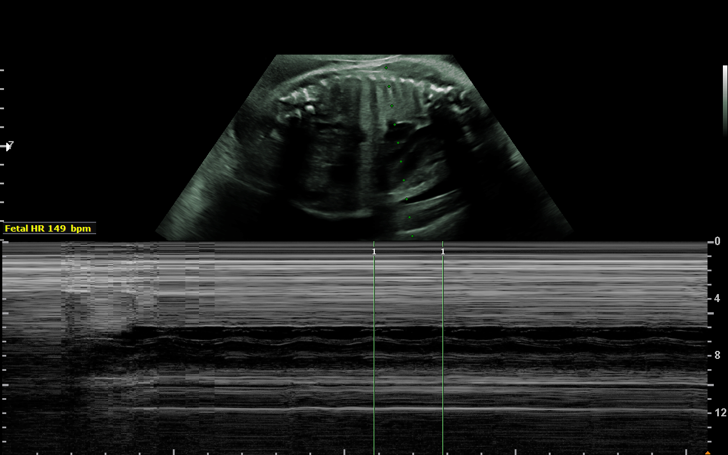
[im 4/14]
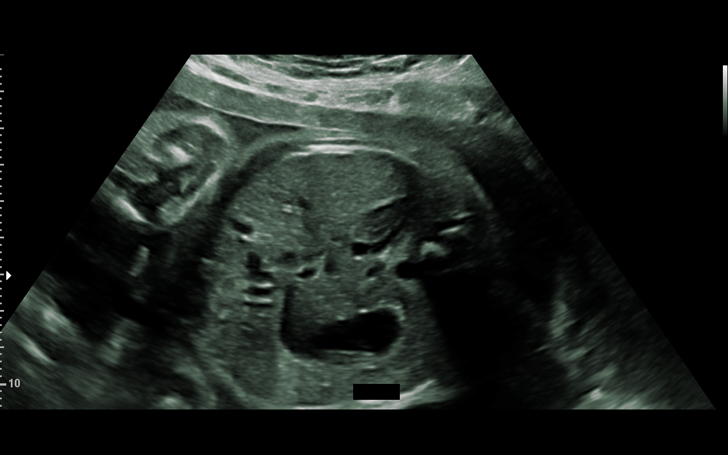
[im 5/14]
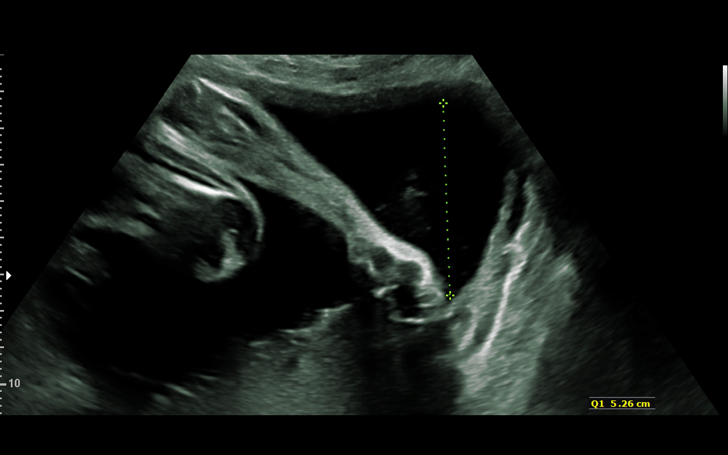
[im 6/14]
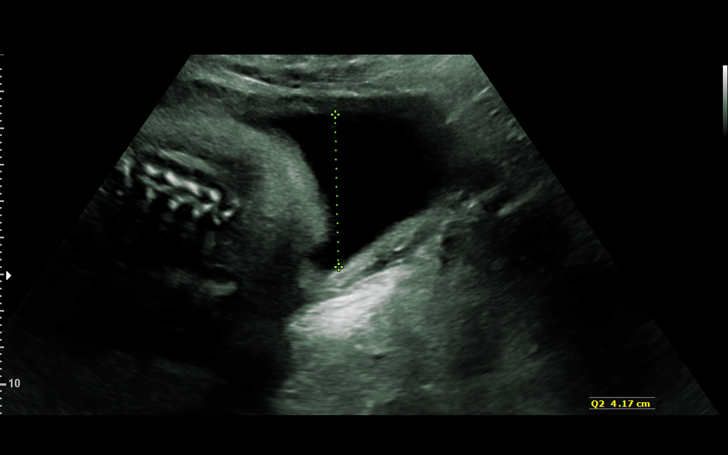
[im 7/14]
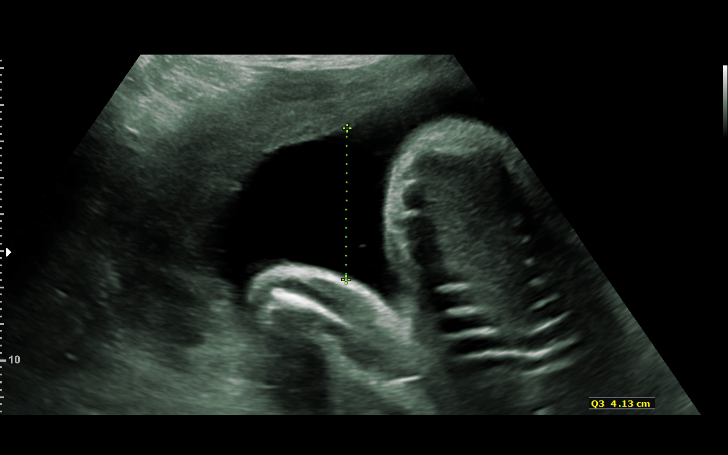
[im 8/14]
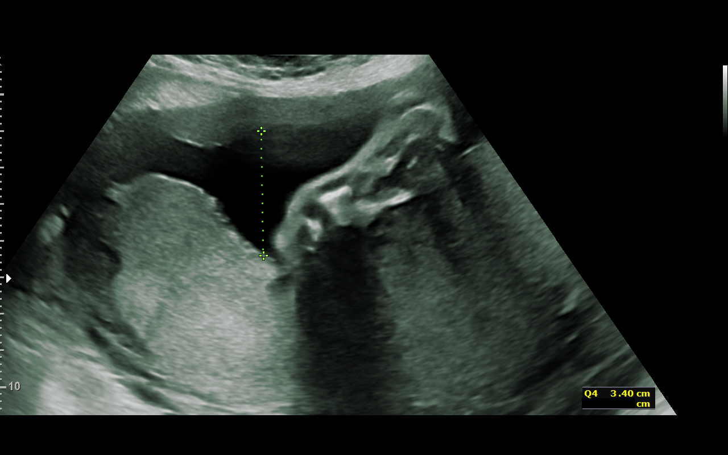
[im 9/14]
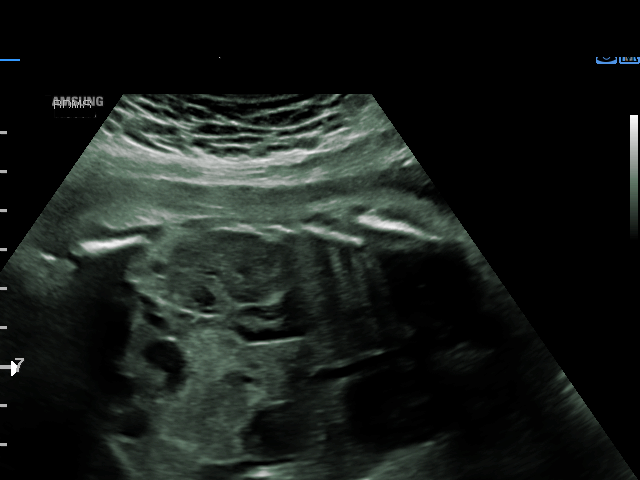
[im 10/14]
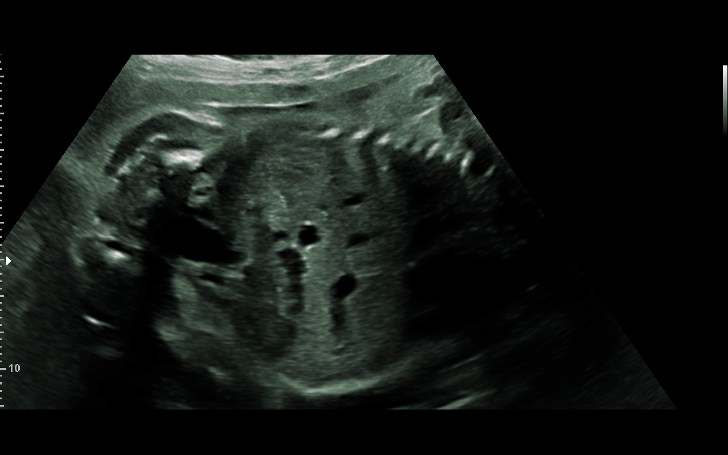
[im 11/14]
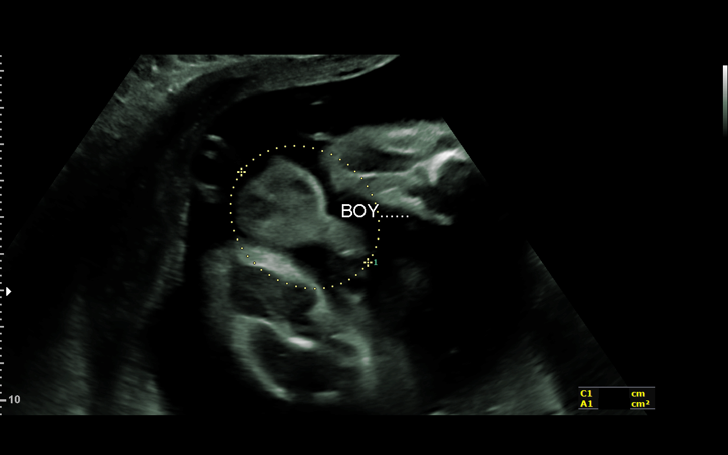
[im 12/14]
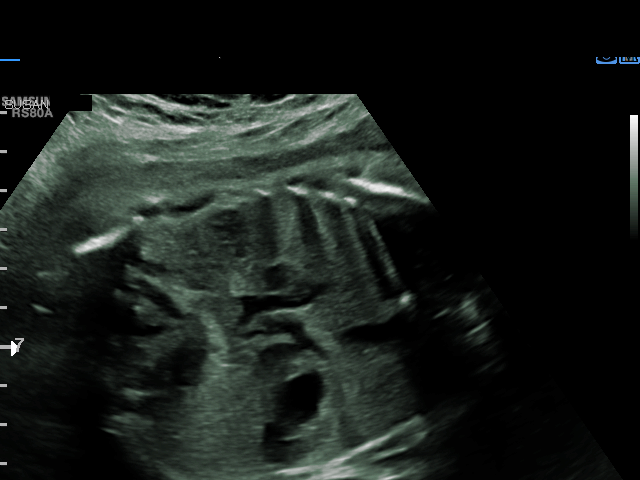
[im 13/14]
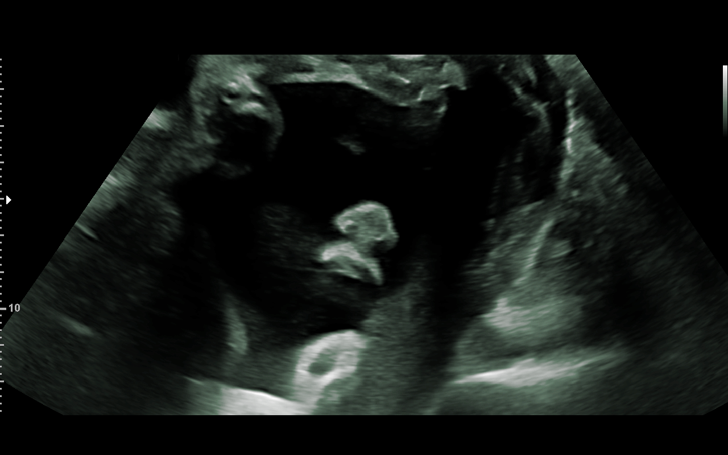
[im 14/14]
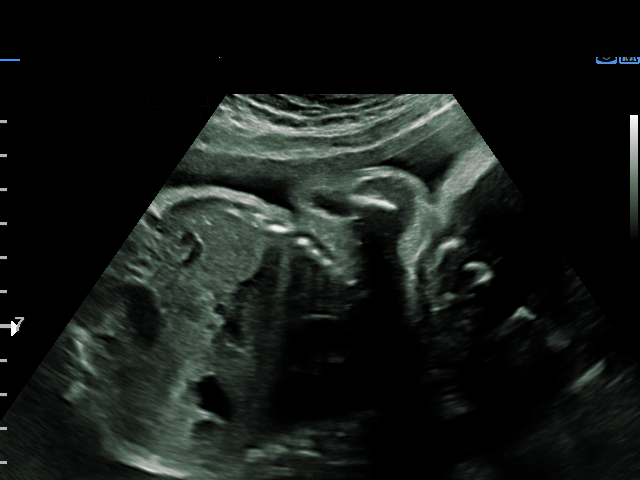

[14 of 14 positions shown; findings below may reference images not displayed]

Name:       NALA TIGER             Visit Date: 06/18/2017 [REDACTED] [HOSPITAL]

1  BEGANAJ SCHMITT-CHORNOGURA              [id number]      [id number]     [id number]
Indications

29 weeks gestation of pregnancy
Short interval between pregancies, 3rd
trimester
Poor obstetric history: Previous IUFD ( 8529,
39 weeks, 5+9, [address])
Late to prenatal care, third trimester
Obesity complicating pregnancy, third
trimester
OB History

Blood Type:            Height:  5'1"   Weight (lb):  181       BMI:
Gravidity:    5         Term:   4        Prem:   0         SAB:   0
TOP:          0       Ectopic:  0        Living: 3
Fetal Evaluation

Num Of Fetuses:     1
Fetal Heart         149
Rate(bpm):
Cardiac Activity:   Observed
Presentation:       Cephalic

Amniotic Fluid
AFI FV:      Subjectively within normal limits

AFI Sum(cm)     %Tile       Largest Pocket(cm)
16.96           63

RUQ(cm)       RLQ(cm)       LUQ(cm)        LLQ(cm)
5.26
Biophysical Evaluation

Amniotic F.V:   Within normal limits       F. Tone:         Observed
F. Movement:    Observed                   Score:           [DATE]
F. Breathing:   Observed
Gestational Age

LMP:           30w 2d        Date:  [DATE]                 EDD:    11/18/16
Best:          29w 1d     Det. By:  Early Ultrasound         EDD:    08/25/17
(09/02/17)
Anatomy

Stomach:               Appears normal, left   Bladder:                Appears normal
sided
Impression

SIUP at 29+1 weeks
Normal amniotic fluid volume
BPP 01/08/17
Recommendations

Continue weekly BPPs

## 2020-09-12 ENCOUNTER — Ambulatory Visit: Payer: Medicaid Other | Admitting: Obstetrics

## 2021-11-18 ENCOUNTER — Emergency Department (HOSPITAL_COMMUNITY)
Admission: EM | Admit: 2021-11-18 | Discharge: 2021-11-18 | Disposition: A | Discharge status: Home / Self Care | Payer: 59 | Attending: Emergency Medicine | Admitting: Emergency Medicine

## 2021-11-18 ENCOUNTER — Other Ambulatory Visit: Payer: Self-pay

## 2021-11-18 ENCOUNTER — Encounter (HOSPITAL_COMMUNITY): Payer: Self-pay | Admitting: Emergency Medicine

## 2021-11-18 DIAGNOSIS — R000 Tachycardia, unspecified: Secondary | ICD-10-CM | POA: Diagnosis not present

## 2021-11-18 DIAGNOSIS — Z20822 Contact with and (suspected) exposure to covid-19: Secondary | ICD-10-CM | POA: Diagnosis not present

## 2021-11-18 DIAGNOSIS — J020 Streptococcal pharyngitis: Secondary | ICD-10-CM | POA: Insufficient documentation

## 2021-11-18 DIAGNOSIS — Z79899 Other long term (current) drug therapy: Secondary | ICD-10-CM | POA: Diagnosis not present

## 2021-11-18 DIAGNOSIS — J029 Acute pharyngitis, unspecified: Secondary | ICD-10-CM | POA: Diagnosis present

## 2021-11-18 DIAGNOSIS — H6692 Otitis media, unspecified, left ear: Secondary | ICD-10-CM | POA: Insufficient documentation

## 2021-11-18 DIAGNOSIS — H6690 Otitis media, unspecified, unspecified ear: Secondary | ICD-10-CM

## 2021-11-18 DIAGNOSIS — Z91010 Allergy to peanuts: Secondary | ICD-10-CM | POA: Diagnosis not present

## 2021-11-18 LAB — GROUP A STREP BY PCR: Group A Strep by PCR: DETECTED — AB

## 2021-11-18 LAB — RESP PANEL BY RT-PCR (FLU A&B, COVID) ARPGX2
Influenza A by PCR: NEGATIVE
Influenza B by PCR: NEGATIVE
SARS Coronavirus 2 by RT PCR: NEGATIVE

## 2021-11-18 MED ORDER — AMOXICILLIN-POT CLAVULANATE 875-125 MG PO TABS: 1.0000 | ORAL_TABLET | Freq: Two times a day (BID) | ORAL | 0 refills | Status: AC

## 2021-11-18 MED ORDER — PENICILLIN G BENZATHINE 1200000 UNIT/2ML IM SUSY
1.2000 10*6.[IU] | PREFILLED_SYRINGE | Freq: Once | INTRAMUSCULAR | Status: AC
  Administered 2021-11-18: 1.2 10*6.[IU] via INTRAMUSCULAR
  Filled 2021-11-18: qty 2

## 2021-11-18 MED ORDER — ACETAMINOPHEN 325 MG PO TABS
650.0000 mg | ORAL_TABLET | Freq: Once | ORAL | Status: AC | PRN
  Administered 2021-11-18: 650 mg via ORAL
  Filled 2021-11-18: qty 2

## 2021-11-18 NOTE — ED Triage Notes (Signed)
C/o sore throat, fever, chills, and L ear pain x 2 days.  Denies cough and congestion. ?

## 2021-11-18 NOTE — Discharge Instructions (Addendum)
Please return to the ED if you have any new or worsening signs or symptoms such as trouble breathing, drooling, change in your voice ?Take Tylenol for fever.  Take ibuprofen for your any body aches or chills. ?Please pick up your antibiotic I have prescribed for your ear infection. ?I have written you a note out for work for the next 3 days ?I have also included an attached informational guide concerning ear infections ?

## 2021-11-18 NOTE — ED Provider Notes (Addendum)
?[facility] EMERGENCY DEPARTMENT ?Provider Note ? ? ?CSN: [id number] ?Arrival date & time: 11/18/21  1117 ? ?  ? ?History ? ?Chief Complaint  ?Patient presents with  ? Sore Throat  ? ? ?Shelby Olsen is a 26 y.o. female with medical history of migraines, measles.  Patient presents to ED for evaluation of sore throat.  Patient states for the last 2 days she has had sore throat, fevers, headache, fever and chills as well as left ear pain.  Patient denies known sick contacts. Patient denies trouble swallowing, drooling, change in phonation, congestion, cough, diarrhea, abdominal pain, nausea or vomiting. ? ? ?Sore Throat ?Associated symptoms include headaches. Pertinent negatives include no abdominal pain.  ? ?  ? ?Home Medications ?Prior to Admission medications   ?Medication Sig Start Date End Date Taking? Authorizing Provider  ?amoxicillin-clavulanate (AUGMENTIN) 875-125 MG tablet Take 1 tablet by mouth 2 (two) times daily. 11/18/21  Yes Al Decant, PA-C  ?amLODipine (NORVASC) 10 MG tablet Take 1 tablet (10 mg total) by mouth daily. 09/26/17   Constant, Peggy, MD  ?ibuprofen (ADVIL,MOTRIN) 600 MG tablet Take 1 tablet (600 mg total) by mouth every 6 (six) hours. ?Patient not taking: Reported on 09/24/2017 08/29/17   Oralia Manis, DO  ?Prenatal Vit-Fe Fumarate-FA (PRENATAL MULTIVITAMIN) TABS tablet Take 1 tablet by mouth every morning. 08/14/15   Trixie Dredge, PA-C  ?   ? ?Allergies    ?Peanuts [peanut oil]   ? ?Review of Systems   ?Review of Systems  ?Constitutional:  Positive for chills and fever.  ?HENT:  Positive for ear pain and sore throat. Negative for drooling, trouble swallowing and voice change.   ?Gastrointestinal:  Negative for abdominal pain, diarrhea, nausea and vomiting.  ?Neurological:  Positive for headaches.  ?All other systems reviewed and are negative. ? ?Physical Exam ?Updated Vital Signs ?BP (!) 157/101   Pulse (!) 123   Temp (!) 102 ?F (38.9 ?C) (Oral)   Resp 20    SpO2 99%  ?Physical Exam ?Vitals and nursing note reviewed.  ?Constitutional:   ?   General: She is not in acute distress. ?   Appearance: She is well-developed. She is not ill-appearing, toxic-appearing or diaphoretic.  ?HENT:  ?   Head: Normocephalic and atraumatic.  ?   Right Ear: Tympanic membrane and ear canal normal.  ?   Left Ear: Tenderness present. No drainage or swelling. A middle ear effusion is present.  ?   Nose: No congestion.  ?   Mouth/Throat:  ?   Mouth: Mucous membranes are moist.  ?   Pharynx: Uvula midline. Oropharyngeal exudate and posterior oropharyngeal erythema present. No uvula swelling.  ?   Tonsils: Tonsillar exudate present. No tonsillar abscesses. 1+ on the right. 1+ on the left.  ?Eyes:  ?   Conjunctiva/sclera: Conjunctivae normal.  ?   Pupils: Pupils are equal, round, and reactive to light.  ?Cardiovascular:  ?   Rate and Rhythm: Normal rate and regular rhythm.  ?Pulmonary:  ?   Effort: Pulmonary effort is normal.  ?   Breath sounds: Normal breath sounds. No wheezing.  ?Abdominal:  ?   General: Bowel sounds are normal.  ?   Palpations: Abdomen is soft.  ?   Tenderness: There is no abdominal tenderness.  ?Musculoskeletal:  ?   Cervical back: Normal range of motion and neck supple.  ?Skin: ?   General: Skin is warm and dry.  ?   Capillary Refill: Capillary refill takes  less than 2 seconds.  ?Neurological:  ?   Mental Status: She is alert and oriented to person, place, and time.  ? ? ?ED Results / Procedures / Treatments   ?Labs ?(all labs ordered are listed, but only abnormal results are displayed) ?Labs Reviewed  ?GROUP A STREP BY PCR - Abnormal; Notable for the following components:  ?    Result Value  ? Group A Strep by PCR DETECTED (*)   ? All other components within normal limits  ?RESP PANEL BY RT-PCR (FLU A&B, COVID) ARPGX2  ? ? ?EKG ?None ? ?Radiology ?No results found. ? ?Procedures ?Procedures  ? ?Medications Ordered in ED ?Medications  ?acetaminophen (TYLENOL) tablet 650 mg  (650 mg Oral Given 11/18/21 1125)  ?penicillin g benzathine (BICILLIN LA) 1200000 UNIT/2ML injection 1.2 Million Units (1.2 Million Units Intramuscular Given 11/18/21 1226)  ? ? ?ED Course/ Medical Decision Making/ A&P ?  ?                        ?Medical Decision Making ?Risk ?OTC drugs. ?Prescription drug management. ? ? ?26 year old female presents to ED for evaluation of sore throat.  Please see HPI for further details ? ?On examination, patient is febrile treated with Tylenol.  Patient is tachycardic to 123 most likely as result of fever.  Patient lung sounds clear bilaterally.  Patient abdomen soft compressible all 4 quadrants. ?Examination of patient oropharynx shows exudate bilaterally, 1+ tonsils.  Patient uvula midline.  Patient handling secretions appropriately.  Doubt RPA, PTA at this time. ?Examination of patient left ear shows middle ear effusion.  Most likely otitis media. ? ?Patient respiratory panel result was positive for strep pharyngitis.  Patient treated with one-time shot of benzathine penicillin G.  Patient was sent home on prescription of amoxicillin for otitis media. ? ?Patient provided return precautions.  Patient voiced understanding.  Patient stable for discharge. ? ? ?Final Clinical Impression(s) / ED Diagnoses ?Final diagnoses:  ?Strep pharyngitis  ?Acute otitis media, unspecified otitis media type  ? ? ?Rx / DC Orders ?ED Discharge Orders   ? ?      Ordered  ?  amoxicillin-clavulanate (AUGMENTIN) 875-125 MG tablet  2 times daily       ? 11/18/21 1224  ? ?  ?  ? ?  ? ? ?  ? ? ?  ?Al Decant, PA-C ?11/18/21 1226 ? ?  ?Lorre Nick, MD ?11/18/21 1500 ? ?

## 2022-11-12 ENCOUNTER — Emergency Department (HOSPITAL_COMMUNITY)
Admission: EM | Admit: 2022-11-12 | Discharge: 2022-11-12 | Disposition: A | Discharge status: Home / Self Care | Payer: Self-pay | Attending: Emergency Medicine | Admitting: Emergency Medicine

## 2022-11-12 ENCOUNTER — Other Ambulatory Visit: Payer: Self-pay

## 2022-11-12 ENCOUNTER — Encounter (HOSPITAL_COMMUNITY): Payer: Self-pay | Admitting: Emergency Medicine

## 2022-11-12 DIAGNOSIS — J020 Streptococcal pharyngitis: Secondary | ICD-10-CM | POA: Insufficient documentation

## 2022-11-12 DIAGNOSIS — H9203 Otalgia, bilateral: Secondary | ICD-10-CM | POA: Insufficient documentation

## 2022-11-12 DIAGNOSIS — R000 Tachycardia, unspecified: Secondary | ICD-10-CM | POA: Insufficient documentation

## 2022-11-12 DIAGNOSIS — Z1152 Encounter for screening for COVID-19: Secondary | ICD-10-CM | POA: Insufficient documentation

## 2022-11-12 LAB — GROUP A STREP BY PCR: Group A Strep by PCR: DETECTED — AB

## 2022-11-12 LAB — RESP PANEL BY RT-PCR (RSV, FLU A&B, COVID)  RVPGX2
Influenza A by PCR: NEGATIVE
Influenza B by PCR: NEGATIVE
Resp Syncytial Virus by PCR: NEGATIVE
SARS Coronavirus 2 by RT PCR: NEGATIVE

## 2022-11-12 LAB — POC URINE PREG, ED: Preg Test, Ur: NEGATIVE

## 2022-11-12 MED ORDER — DEXAMETHASONE SODIUM PHOSPHATE 10 MG/ML IJ SOLN
10.0000 mg | Freq: Once | INTRAMUSCULAR | Status: AC
  Administered 2022-11-12: 10 mg via INTRAMUSCULAR
  Filled 2022-11-12: qty 1

## 2022-11-12 MED ORDER — ACETAMINOPHEN 325 MG PO TABS
650.0000 mg | ORAL_TABLET | Freq: Once | ORAL | Status: AC
  Administered 2022-11-12: 650 mg via ORAL
  Filled 2022-11-12: qty 2

## 2022-11-12 MED ORDER — KETOROLAC TROMETHAMINE 15 MG/ML IJ SOLN
15.0000 mg | Freq: Once | INTRAMUSCULAR | Status: AC
  Administered 2022-11-12: 15 mg via INTRAMUSCULAR
  Filled 2022-11-12: qty 1

## 2022-11-12 MED ORDER — PENICILLIN G BENZATHINE 1200000 UNIT/2ML IM SUSY
1.2000 10*6.[IU] | PREFILLED_SYRINGE | Freq: Once | INTRAMUSCULAR | Status: AC
  Administered 2022-11-12: 1.2 10*6.[IU] via INTRAMUSCULAR
  Filled 2022-11-12: qty 2

## 2022-11-12 NOTE — ED Triage Notes (Signed)
Pt states she started having a sore throat last night. Woke up with bilateral ear pain and headache. Pt also states she feels like she has had a fever.

## 2022-11-12 NOTE — Discharge Instructions (Signed)
It was a pleasure take care of you today.  As discussed, your strep test was positive.  You were treated here in the ER for strep throat.  There was no evidence of an ear infection.  Continue to take ibuprofen or Tylenol as needed for fever and pain.  Return to the ER for new or worsening symptoms.

## 2022-11-12 NOTE — ED Provider Notes (Signed)
[facility] Provider Note   CSN: [id number] Arrival date & time: 11/12/22  1146     History  Chief Complaint  Patient presents with   Sore Throat   Otalgia    Shelby Olsen is a 27 y.o. female with a past medical history significant for migraines who presents to the ED due to sore throat that started last night.  Patient also admits to bilateral otalgia and headache.  Admits to a subjective fever.  Patient's older son is sick with similar symptoms.  Denies difficulty swallowing or shortness of breath.  No changes to phonation.  Denies nausea, vomiting, diarrhea.  History obtained from patient and past medical records. No interpreter used during encounter.       Home Medications Prior to Admission medications   Medication Sig Start Date End Date Taking? Authorizing Provider  amLODipine (NORVASC) 10 MG tablet Take 1 tablet (10 mg total) by mouth daily. 09/26/17   Constant, Peggy, MD  amoxicillin-clavulanate (AUGMENTIN) 875-125 MG tablet Take 1 tablet by mouth 2 (two) times daily. 11/18/21   Azucena Cecil, PA-C  ibuprofen (ADVIL,MOTRIN) 600 MG tablet Take 1 tablet (600 mg total) by mouth every 6 (six) hours. Patient not taking: Reported on 09/24/2017 08/29/17   Copper Basnett More, DO  Prenatal Vit-Fe Fumarate-FA (PRENATAL MULTIVITAMIN) TABS tablet Take 1 tablet by mouth every morning. 08/14/15   Clayton Bibles, PA-C      Allergies    Peanuts [peanut oil]    Review of Systems   Review of Systems  Constitutional:  Positive for chills and fever.  HENT:  Positive for sore throat. Negative for trouble swallowing and voice change.   Respiratory:  Negative for shortness of breath.   Cardiovascular:  Negative for chest pain.    Physical Exam Updated Vital Signs BP (!) 141/91   Pulse (!) 114   Temp (!) 100.7 F (38.2 C) (Oral)   Resp 16   SpO2 99%  Physical Exam Vitals and nursing note reviewed.  Constitutional:      General:  She is not in acute distress.    Appearance: She is not ill-appearing.  HENT:     Head: Normocephalic.     Mouth/Throat:     Comments: Posterior oropharynx clear and mucous membranes moist, there is mild erythema but no edema or tonsillar exudates, uvula midline, normal phonation, no trismus, tolerating secretions without difficulty. Eyes:     Pupils: Pupils are equal, round, and reactive to light.  Cardiovascular:     Rate and Rhythm: Normal rate and regular rhythm.     Pulses: Normal pulses.     Heart sounds: Normal heart sounds. No murmur heard.    No friction rub. No gallop.  Pulmonary:     Effort: Pulmonary effort is normal.     Breath sounds: Normal breath sounds.  Abdominal:     General: Abdomen is flat. There is no distension.     Palpations: Abdomen is soft.     Tenderness: There is no abdominal tenderness. There is no guarding or rebound.  Musculoskeletal:        General: Normal range of motion.     Cervical back: Neck supple.  Skin:    General: Skin is warm and dry.  Neurological:     General: No focal deficit present.     Mental Status: She is alert.  Psychiatric:        Mood and Affect: Mood normal.  Behavior: Behavior normal.     ED Results / Procedures / Treatments   Labs (all labs ordered are listed, but only abnormal results are displayed) Labs Reviewed  GROUP A STREP BY PCR - Abnormal; Notable for the following components:      Result Value   Group A Strep by PCR DETECTED (*)    All other components within normal limits  RESP PANEL BY RT-PCR (RSV, FLU A&B, COVID)  RVPGX2  POC URINE PREG, ED    EKG None  Radiology No results found.  Procedures Procedures    Medications Ordered in ED Medications  acetaminophen (TYLENOL) tablet 650 mg (650 mg Oral Given 11/12/22 1251)  ketorolac (TORADOL) 15 MG/ML injection 15 mg (15 mg Intramuscular Given 11/12/22 1428)  dexamethasone (DECADRON) injection 10 mg (10 mg Intramuscular Given 11/12/22 1429)   penicillin g benzathine (BICILLIN LA) 1200000 UNIT/2ML injection 1.2 Million Units (1.2 Million Units Intramuscular Given 11/12/22 1431)    ED Course/ Medical Decision Making/ A&P Clinical Course as of 11/12/22 1513  Tue Nov 12, 2022  1309 Group A Strep by PCR(!): DETECTED [CA]    Clinical Course User Index [CA] Suzy Bouchard, PA-C                             Medical Decision Making Amount and/or Complexity of Data Reviewed Labs:  Decision-making details documented in ED Course.  Risk OTC drugs. Prescription drug management.   27 year old female presents to the ED due to sore throat and bilateral otalgia that started last night.  Son sick with similar symptoms.  Upon arrival, patient febrile 100.7 F and tachycardic at 114.  Patient nontoxic-appearing.  Mild erythema to throat without tonsillar hypertrophy or exudates.  No abscess.  Patient tolerating oral secretions without difficulties.  Airway patent.  TM unremarkable bilaterally, no evidence of AOM. Strep positive.  Patient treated with Bicillin, Toradol, and Decadron.  No meningismus to suggest meningitis.  Patient stable for discharge.  Advised patient follow-up with PCP if symptoms unimproved over the next week. Strict ED precautions discussed with patient. Patient states understanding and agrees to plan. Patient discharged home in no acute distress and stable vitals  No PCP       Final Clinical Impression(s) / ED Diagnoses Final diagnoses:  Strep throat  Otalgia of both ears    Rx / DC Orders ED Discharge Orders     None         Karie Kirks 11/12/22 1514    Blanchie Dessert, MD 11/15/22 [id number]
